# Patient Record
Sex: Female | Born: 2000 | Race: Black or African American | Hispanic: No | Marital: Single | Smoking: Never smoker
Health system: Southern US, Community
[De-identification: ages and names within clinical notes are randomized; demographics above are authoritative.]

## PROBLEM LIST (undated history)

## (undated) DIAGNOSIS — J45909 Unspecified asthma, uncomplicated: Secondary | ICD-10-CM

## (undated) DIAGNOSIS — O139 Gestational [pregnancy-induced] hypertension without significant proteinuria, unspecified trimester: Secondary | ICD-10-CM

## (undated) HISTORY — DX: Gestational (pregnancy-induced) hypertension without significant proteinuria, unspecified trimester: O13.9

## (undated) HISTORY — PX: MOUTH SURGERY: SHX715

---
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
## 2019-01-02 ENCOUNTER — Other Ambulatory Visit: Payer: Self-pay

## 2019-01-02 ENCOUNTER — Ambulatory Visit (INDEPENDENT_AMBULATORY_CARE_PROVIDER_SITE_OTHER): Payer: Medicaid Other | Admitting: Obstetrics and Gynecology

## 2019-01-02 ENCOUNTER — Other Ambulatory Visit (HOSPITAL_COMMUNITY)
Admission: RE | Admit: 2019-01-02 | Discharge: 2019-01-02 | Disposition: A | Discharge status: Home / Self Care | Payer: Medicaid Other | Source: Ambulatory Visit | Attending: Obstetrics and Gynecology | Admitting: Obstetrics and Gynecology

## 2019-01-02 VITALS — BP 130/85 | HR 88 | Wt 144.0 lb

## 2019-01-02 DIAGNOSIS — A749 Chlamydial infection, unspecified: Secondary | ICD-10-CM

## 2019-01-02 DIAGNOSIS — O98811 Other maternal infectious and parasitic diseases complicating pregnancy, first trimester: Secondary | ICD-10-CM | POA: Insufficient documentation

## 2019-01-02 DIAGNOSIS — O98812 Other maternal infectious and parasitic diseases complicating pregnancy, second trimester: Secondary | ICD-10-CM

## 2019-01-02 DIAGNOSIS — Z3480 Encounter for supervision of other normal pregnancy, unspecified trimester: Secondary | ICD-10-CM

## 2019-01-02 DIAGNOSIS — Z3A22 22 weeks gestation of pregnancy: Secondary | ICD-10-CM

## 2019-01-02 NOTE — Progress Notes (Signed)
Prenatal Visit Note Date: 01/02/2019 Clinic: [facility]  Subjective:  Charlissa Petros is a 18 y.o. G1P0 at [redacted]w[redacted]d being seen today for ongoing prenatal care.  She is currently monitored for the following issues for this low-risk pregnancy and has Ludwig's angina; Supervision of other normal pregnancy, antepartum; and Chlamydia infection affecting pregnancy in first trimester on their problem list.  Patient reports no complaints.   Contractions: Not present. Vag. Bleeding: None.  Movement: Present. Denies leaking of fluid.   The following portions of the patient's history were reviewed and updated as appropriate: allergies, current medications, past family history, past medical history, past social history, past surgical history and problem list. Problem list updated.  Objective:   Vitals:   01/02/19 1357  BP: 130/85  Pulse: 88  Weight: 144 lb (65.3 kg)    Fetal Status: Fetal Heart Rate (bpm): 140 Fundal Height: 21 cm Movement: Present     General:  Alert, oriented and cooperative. Patient is in no acute distress.  Skin: Skin is warm and dry. No rash noted.   Cardiovascular: Normal heart rate noted  Respiratory: Normal respiratory effort, no problems with respiration noted  Abdomen: Soft, gravid, appropriate for gestational age. Pain/Pressure: Absent     Pelvic:  Cervical exam deferred        Extremities: Normal range of motion.  Edema: None  Mental Status: Normal mood and affect. Normal behavior. Normal judgment and thought content.   Urinalysis:      Assessment and Plan:  Pregnancy: G1P0 at [redacted]w[redacted]d  1. Supervision of other normal pregnancy, antepartum Routine care. 28wk labs nv - Cervicovaginal ancillary only( [place])  2. Chlamydia infection affecting pregnancy in first trimester toc today - Cervicovaginal ancillary only( [place])  Preterm labor symptoms and general obstetric precautions including but not limited to vaginal bleeding,  contractions, leaking of fluid and fetal movement were reviewed in detail with the patient. Please refer to After Visit Summary for other counseling recommendations.  Return in about 4 weeks (around 01/30/2019) for low risk, in person, 2hr GTT.   Aletha Halim, MD

## 2019-01-02 NOTE — Progress Notes (Signed)
Pt. Declines flu shot at this time - 01/02/2019  Pt. Has no questions or concerns at this time.

## 2019-01-06 ENCOUNTER — Ambulatory Visit (HOSPITAL_COMMUNITY)
Admission: RE | Admit: 2019-01-06 | Discharge: 2019-01-06 | Disposition: A | Discharge status: Home / Self Care | Payer: Medicaid Other | Source: Ambulatory Visit | Attending: Obstetrics and Gynecology | Admitting: Obstetrics and Gynecology

## 2019-01-06 ENCOUNTER — Other Ambulatory Visit: Payer: Self-pay

## 2019-01-06 DIAGNOSIS — Z362 Encounter for other antenatal screening follow-up: Secondary | ICD-10-CM

## 2019-01-06 DIAGNOSIS — Z3A23 23 weeks gestation of pregnancy: Secondary | ICD-10-CM | POA: Diagnosis not present

## 2019-01-07 LAB — CERVICOVAGINAL ANCILLARY ONLY
Chlamydia: POSITIVE — AB
Comment: NEGATIVE
Comment: NEGATIVE
Comment: NORMAL
Neisseria Gonorrhea: NEGATIVE
Trichomonas: NEGATIVE

## 2019-01-08 ENCOUNTER — Other Ambulatory Visit: Payer: Self-pay | Admitting: Obstetrics and Gynecology

## 2019-01-08 DIAGNOSIS — A749 Chlamydial infection, unspecified: Secondary | ICD-10-CM

## 2019-01-08 MED ORDER — AZITHROMYCIN 500 MG PO TABS: 1000.0000 mg | ORAL_TABLET | Freq: Once | ORAL | 1 refills | Status: AC

## 2019-01-28 NOTE — Telephone Encounter (Signed)
Called pt to follow up on her message. Informed pt to increase her fluids and to rest today and to recheck BP later this evening. Also instructed that if chest pain or SOB continues to go to [facility]. To let us know if this happens again or if BP is elevated. Pt states baby is moving well, and denies any vaginal bleeding. Pt verbalizes and understands.

## 2019-01-30 ENCOUNTER — Other Ambulatory Visit: Payer: Self-pay

## 2019-01-30 ENCOUNTER — Ambulatory Visit (INDEPENDENT_AMBULATORY_CARE_PROVIDER_SITE_OTHER): Payer: Medicaid Other | Admitting: Obstetrics and Gynecology

## 2019-01-30 ENCOUNTER — Other Ambulatory Visit: Payer: Medicaid Other

## 2019-01-30 VITALS — BP 128/83 | HR 98 | Wt 148.4 lb

## 2019-01-30 DIAGNOSIS — Z3A26 26 weeks gestation of pregnancy: Secondary | ICD-10-CM

## 2019-01-30 DIAGNOSIS — A749 Chlamydial infection, unspecified: Secondary | ICD-10-CM

## 2019-01-30 DIAGNOSIS — Z3480 Encounter for supervision of other normal pregnancy, unspecified trimester: Secondary | ICD-10-CM

## 2019-01-30 DIAGNOSIS — O98812 Other maternal infectious and parasitic diseases complicating pregnancy, second trimester: Secondary | ICD-10-CM

## 2019-01-30 NOTE — Progress Notes (Signed)
Prenatal Visit Note Date: 01/30/2019 Clinic: [facility]  Subjective:  Julie Spears is a 18 y.o. G1P0 at [redacted]w[redacted]d being seen today for ongoing prenatal care.  She is currently monitored for the following issues for this low-risk pregnancy and has Ludwig's angina; Supervision of other normal pregnancy, antepartum; and Chlamydia infection affecting pregnancy in first trimester on their problem list.  Patient reports felt dizzy and lightheaded at work ([employer]) on Tuesday Contractions: Not present. Vag. Bleeding: None.  Movement: Present. Denies leaking of fluid.   The following portions of the patient's history were reviewed and updated as appropriate: allergies, current medications, past family history, past medical history, past social history, past surgical history and problem list. Problem list updated.  Objective:   Vitals:   01/30/19 0835  BP: 128/83  Pulse: 98  Weight: 148 lb 6.4 oz (67.3 kg)    Fetal Status: Fetal Heart Rate (bpm): 143 Fundal Height: 26 cm Movement: Present     General:  Alert, oriented and cooperative. Patient is in no acute distress.  Skin: Skin is warm and dry. No rash noted.   Cardiovascular: Normal heart rate noted  Respiratory: Normal respiratory effort, no problems with respiration noted  Abdomen: Soft, gravid, appropriate for gestational age. Pain/Pressure: Absent     Pelvic:  Cervical exam deferred        Extremities: Normal range of motion.  Edema: None  Mental Status: Normal mood and affect. Normal behavior. Normal judgment and thought content.   Urinalysis:      Assessment and Plan:  Pregnancy: G1P0 at [redacted]w[redacted]d  1. Supervision of other normal pregnancy, antepartum Watch for s/s but recommend snacking regularly, eating/drinking something if that happens again and compression stockings and belly wrap as event sounds vagal/preload related.  - HIV antibody (with reflex) - RPR - CBC - Glucose Tolerance, 2 Hours w/1  Hour  2. Chlamydia infection affecting pregnancy in first trimester toc nv  Preterm labor symptoms and general obstetric precautions including but not limited to vaginal bleeding, contractions, leaking of fluid and fetal movement were reviewed in detail with the patient. Please refer to After Visit Summary for other counseling recommendations.  Return in about 3 weeks (around 02/20/2019) for low risk, in person.   Aletha Halim, MD

## 2019-01-31 LAB — CBC
Hematocrit: 33 % — ABNORMAL LOW (ref 34.0–46.6)
Hemoglobin: 11.2 g/dL (ref 11.1–15.9)
MCH: 30.2 pg (ref 26.6–33.0)
MCHC: 33.9 g/dL (ref 31.5–35.7)
MCV: 89 fL (ref 79–97)
Platelets: 258 10*3/uL (ref 150–450)
RBC: 3.71 x10E6/uL — ABNORMAL LOW (ref 3.77–5.28)
RDW: 11.9 % (ref 11.7–15.4)
WBC: 12.7 10*3/uL — ABNORMAL HIGH (ref 3.4–10.8)

## 2019-01-31 LAB — GLUCOSE TOLERANCE, 2 HOURS W/ 1HR
Glucose, 1 hour: 103 mg/dL (ref 65–179)
Glucose, 2 hour: 84 mg/dL (ref 65–152)
Glucose, Fasting: 70 mg/dL (ref 65–91)

## 2019-01-31 LAB — HIV ANTIBODY (ROUTINE TESTING W REFLEX): HIV Screen 4th Generation wRfx: NONREACTIVE

## 2019-01-31 LAB — VITAMIN D 25 HYDROXY (VIT D DEFICIENCY, FRACTURES): Vit D, 25-Hydroxy: 10.8 ng/mL — ABNORMAL LOW (ref 30.0–100.0)

## 2019-01-31 LAB — RPR: RPR Ser Ql: NONREACTIVE

## 2019-02-10 ENCOUNTER — Other Ambulatory Visit: Payer: Self-pay | Admitting: Obstetrics and Gynecology

## 2019-02-10 DIAGNOSIS — R7989 Other specified abnormal findings of blood chemistry: Secondary | ICD-10-CM | POA: Insufficient documentation

## 2019-02-10 MED ORDER — CHOLECALCIFEROL 1.25 MG (50000 UT) PO CAPS: 50000.0000 [IU] | ORAL_CAPSULE | ORAL | 0 refills | Status: DC

## 2019-02-20 ENCOUNTER — Other Ambulatory Visit: Payer: Self-pay

## 2019-02-20 ENCOUNTER — Telehealth (INDEPENDENT_AMBULATORY_CARE_PROVIDER_SITE_OTHER): Payer: Medicaid Other | Admitting: Obstetrics and Gynecology

## 2019-02-20 DIAGNOSIS — Z3A29 29 weeks gestation of pregnancy: Secondary | ICD-10-CM

## 2019-02-20 DIAGNOSIS — R7989 Other specified abnormal findings of blood chemistry: Secondary | ICD-10-CM

## 2019-02-20 DIAGNOSIS — A749 Chlamydial infection, unspecified: Secondary | ICD-10-CM

## 2019-02-20 DIAGNOSIS — O98813 Other maternal infectious and parasitic diseases complicating pregnancy, third trimester: Secondary | ICD-10-CM | POA: Diagnosis not present

## 2019-02-20 DIAGNOSIS — Z3480 Encounter for supervision of other normal pregnancy, unspecified trimester: Secondary | ICD-10-CM

## 2019-02-20 NOTE — Progress Notes (Signed)
I connected with  Julie Spears on 02/20/19 at  1:45 PM EST by telephone and verified that I am speaking with the correct person using two identifiers.   I discussed the limitations, risks, security and privacy concerns of performing an evaluation and management service by telephone and the availability of in person appointments. I also discussed with the patient that there may be a patient responsible charge related to this service. The patient expressed understanding and agreed to proceed.  Haydan Mansouri Jeanella Anton, CMA 02/20/2019  1:45 PM

## 2019-02-20 NOTE — Progress Notes (Signed)
   TELEHEALTH VIRTUAL OBSTETRICS VISIT ENCOUNTER NOTE  Clinic: [facility]  I connected with Julie Spears on 02/20/19 at  1:45 PM EST by telephone at home and verified that I am speaking with the correct person using two identifiers.   I discussed the limitations, risks, security and privacy concerns of performing an evaluation and management service by telephone and the availability of in person appointments. I also discussed with the patient that there may be a patient responsible charge related to this service. The patient expressed understanding and agreed to proceed.  Subjective:  Julie Spears is a 18 y.o. G1P0 at [redacted]w[redacted]d being followed for ongoing prenatal care.  She is currently monitored for the following issues for this low-risk pregnancy and has Ludwig's angina; Supervision of other normal pregnancy, antepartum; Chlamydia infection affecting pregnancy in first trimester; and Low serum vitamin D on their problem list.  Patient reports no complaints. Reports fetal movement. Denies any contractions, bleeding or leaking of fluid.   The following portions of the patient's history were reviewed and updated as appropriate: allergies, current medications, past family history, past medical history, past social history, past surgical history and problem list.   Objective:  There were no vitals filed for this visit.  Babyscripts Data Reviewed: not applicable  General:  Alert, oriented and cooperative.   Mental Status: Normal mood and affect perceived. Normal judgment and thought content.  Rest of physical exam deferred due to type of encounter  Assessment and Plan:  Pregnancy: G1P0 at [redacted]w[redacted]d 1. Supervision of other normal pregnancy, antepartum Routine care  2. Low serum vitamin D Needs to pick up  3. Chlamydia infection affecting pregnancy in first trimester Needs toc  Preterm labor symptoms and general obstetric precautions including but not limited to  vaginal bleeding, contractions, leaking of fluid and fetal movement were reviewed in detail with the patient.  I discussed the assessment and treatment plan with the patient. The patient was provided an opportunity to ask questions and all were answered. The patient agreed with the plan and demonstrated an understanding of the instructions. The patient was advised to call back or seek an in-person office evaluation/go to MAU at [facility] for any urgent or concerning symptoms. Please refer to After Visit Summary for other counseling recommendations.   I provided 7 minutes of non-face-to-face time during this encounter. The visit was conducted via MyChart-medicine  No follow-ups on file.  Future Appointments  Date Time Provider [place]  03/13/2019 10:15 AM Donnamae Jude, MD [facility]    Aletha Halim, [place] for [facility], [place]

## 2019-03-13 ENCOUNTER — Other Ambulatory Visit: Payer: Self-pay

## 2019-03-13 ENCOUNTER — Other Ambulatory Visit (HOSPITAL_COMMUNITY)
Admission: RE | Admit: 2019-03-13 | Discharge: 2019-03-13 | Disposition: A | Discharge status: Home / Self Care | Payer: Medicaid Other | Source: Ambulatory Visit | Attending: Family Medicine | Admitting: Family Medicine

## 2019-03-13 ENCOUNTER — Ambulatory Visit (INDEPENDENT_AMBULATORY_CARE_PROVIDER_SITE_OTHER): Payer: Medicaid Other | Admitting: Family Medicine

## 2019-03-13 VITALS — BP 123/80 | HR 94 | Wt 160.0 lb

## 2019-03-13 DIAGNOSIS — Z3A32 32 weeks gestation of pregnancy: Secondary | ICD-10-CM

## 2019-03-13 DIAGNOSIS — Z23 Encounter for immunization: Secondary | ICD-10-CM

## 2019-03-13 DIAGNOSIS — Z3480 Encounter for supervision of other normal pregnancy, unspecified trimester: Secondary | ICD-10-CM | POA: Insufficient documentation

## 2019-03-13 DIAGNOSIS — O98811 Other maternal infectious and parasitic diseases complicating pregnancy, first trimester: Secondary | ICD-10-CM

## 2019-03-13 DIAGNOSIS — A749 Chlamydial infection, unspecified: Secondary | ICD-10-CM

## 2019-03-13 NOTE — Patient Instructions (Signed)

## 2019-03-13 NOTE — Progress Notes (Signed)
   PRENATAL VISIT NOTE  Subjective:  Julie Spears is a 18 y.o. G1P0 at [redacted]w[redacted]d being seen today for ongoing prenatal care.  She is currently monitored for the following issues for this low-risk pregnancy and has Ludwig's angina; Supervision of other normal pregnancy, antepartum; Chlamydia infection affecting pregnancy in first trimester; and Low serum vitamin D on their problem list.  Patient reports no complaints.  Contractions: Not present. Vag. Bleeding: None.  Movement: Present. Denies leaking of fluid.   The following portions of the patient's history were reviewed and updated as appropriate: allergies, current medications, past family history, past medical history, past social history, past surgical history and problem list.   Objective:   Vitals:   03/13/19 1023  BP: 123/80  Pulse: 94  Weight: 160 lb (72.6 kg)    Fetal Status: Fetal Heart Rate (bpm): 135   Movement: Present     General:  Alert, oriented and cooperative. Patient is in no acute distress.  Skin: Skin is warm and dry. No rash noted.   Cardiovascular: Normal heart rate noted  Respiratory: Normal respiratory effort, no problems with respiration noted  Abdomen: Soft, gravid, appropriate for gestational age.  Pain/Pressure: Present     Pelvic: Cervical exam deferred        Extremities: Normal range of motion.  Edema: None  Mental Status: Normal mood and affect. Normal behavior. Normal judgment and thought content.   Assessment and Plan:  Pregnancy: G1P0 at [redacted]w[redacted]d 1. Supervision of other normal pregnancy, antepartum Continue routine prenatal care. To choose pediatrician TDaP given today - Cervicovaginal ancillary only( [place])  2. Chlamydia infection affecting pregnancy in first trimester TOC done  Preterm labor symptoms and general obstetric precautions including but not limited to vaginal bleeding, contractions, leaking of fluid and fetal movement were reviewed in detail with the patient. Please refer to  After Visit Summary for other counseling recommendations.   Return in about 2 weeks (around 03/27/2019) for virtual.  Future Appointments  Date Time Provider [place]  03/27/2019 11:15 AM Anyanwu, Sallyanne Havers, MD [facility]    Donnamae Jude, MD

## 2019-03-14 NOTE — L&D Delivery Note (Signed)
OB/GYN Faculty Practice Delivery Note  Julie Spears is a 19 y.o. G1P0 s/p NSVD at [redacted]w[redacted]d. She was admitted for IOL for PreE without severe features.   ROM: 3h 28m with clear fluid GBS Status:  --Theda Sers (01/28 1424) Maximum Maternal Temperature: 98.3 F    Labor Progress: . Patient arrived at fingertipdilation and was induced with misoprostol x2, then was 3cm and started on pitocin briefly but then had precipitous dilation and delivery.   Delivery Date/Time: 04/14/2019 at 1819 Delivery: Called to room and patient was complete and pushing. Head delivered in ROA position. No nuchal cord present. Shoulder and body delivered in usual fashion. Infant with spontaneous cry, placed on mother's abdomen, dried and stimulated. Cord clamped x 2 after 1-minute delay, and cut by FOB. Cord blood drawn. Placenta delivered spontaneously with gentle cord traction. Fundus firm with massage and Pitocin. Labia, perineum, vagina, and cervix inspected with bilateral labial lacerations, R side was repaired with 4-0 monocryl.   Placenta: 3v intact, to L&D Complications: none Lacerations: bilateral labial lacerations, R side was repaired with 4-0 monocryl EBL: 50 Analgesia: none   Infant: APGAR (1 MIN): 9   APGAR (5 MINS): 9    Weight: 2648 grams  Zack Seal, MD/[facility] OB/GYN Fellow, Faculty Practice

## 2019-03-17 LAB — CERVICOVAGINAL ANCILLARY ONLY
Chlamydia: POSITIVE — AB
Comment: NEGATIVE
Comment: NORMAL
Neisseria Gonorrhea: NEGATIVE

## 2019-03-18 MED ORDER — AZITHROMYCIN 500 MG PO TABS: 1000.0000 mg | ORAL_TABLET | Freq: Once | ORAL | 1 refills | Status: AC

## 2019-03-18 NOTE — Addendum Note (Signed)
Addended by: Reva Bores on: 03/18/2019 07:42 AM   Modules accepted: Orders

## 2019-03-27 ENCOUNTER — Encounter: Payer: Self-pay | Admitting: Obstetrics & Gynecology

## 2019-03-27 ENCOUNTER — Other Ambulatory Visit: Payer: Self-pay

## 2019-03-27 ENCOUNTER — Telehealth (INDEPENDENT_AMBULATORY_CARE_PROVIDER_SITE_OTHER): Payer: Medicaid Other | Admitting: Obstetrics & Gynecology

## 2019-03-27 VITALS — BP 129/77 | HR 94

## 2019-03-27 DIAGNOSIS — O98813 Other maternal infectious and parasitic diseases complicating pregnancy, third trimester: Secondary | ICD-10-CM

## 2019-03-27 DIAGNOSIS — A749 Chlamydial infection, unspecified: Secondary | ICD-10-CM

## 2019-03-27 DIAGNOSIS — Z3A34 34 weeks gestation of pregnancy: Secondary | ICD-10-CM

## 2019-03-27 DIAGNOSIS — Z3480 Encounter for supervision of other normal pregnancy, unspecified trimester: Secondary | ICD-10-CM

## 2019-03-27 MED ORDER — AZITHROMYCIN 500 MG PO TABS: 1000.0000 mg | ORAL_TABLET | Freq: Once | ORAL | 1 refills | Status: AC

## 2019-03-27 NOTE — Patient Instructions (Signed)
Return to office for any scheduled appointments. Call the office or go to the MAU at [facility] if:  You begin to have strong, frequent contractions  Your water breaks.  Sometimes it is a big gush of fluid, sometimes it is just a trickle that keeps getting your panties wet or running down your legs  You have vaginal bleeding.  It is normal to have a small amount of spotting if your cervix was checked.   You do not feel your baby moving like normal.  If you do not, get something to eat and drink and lay down and focus on feeling your baby move.   If your baby is still not moving like normal, you should call the office or go to MAU.  Any other obstetric concerns.   

## 2019-03-27 NOTE — Progress Notes (Signed)
TELEHEALTH OBSTETRICS PRENATAL VIRTUAL VIDEO VISIT ENCOUNTER NOTE  Provider location: Center for [place] at [facility]   I connected with [place] on 03/27/19 at 11:15 AM EST by MyChart Video Encounter at home and verified that I am speaking with the correct person using two identifiers.   I discussed the limitations, risks, security and privacy concerns of performing an evaluation and management service virtually and the availability of in person appointments. I also discussed with the patient that there may be a patient responsible charge related to this service. The patient expressed understanding and agreed to proceed. Subjective:  Julie Spears is a 19 y.o. G1P0 at [redacted]w[redacted]d being seen today for ongoing prenatal care.  She is currently monitored for the following issues for this low-risk pregnancy and has Ludwig's angina; Supervision of other normal pregnancy, antepartum; Chlamydia infection affecting pregnancy; and Low serum vitamin D on their problem list.   Patient reports no complaints.  Contractions: Not present. Vag. Bleeding: None.  Movement: Present. Denies any leaking of fluid.   The following portions of the patient's history were reviewed and updated as appropriate: allergies, current medications, past family history, past medical history, past social history, past surgical history and problem list.   Objective:   Vitals:   03/27/19 1115  BP: 129/77  Pulse: 94    Fetal Status:     Movement: Present     General:  Alert, oriented and cooperative. Patient is in no acute distress.  Respiratory: Normal respiratory effort, no problems with respiration noted  Mental Status: Normal mood and affect. Normal behavior. Normal judgment and thought content.  Rest of physical exam deferred due to type of encounter  Labs: Results for orders placed or performed in visit on 03/13/19 (from the past 504 hour(s))  Cervicovaginal ancillary only( [place])   Collection  Time: 03/13/19 10:40 AM  Result Value Ref Range   Neisseria Gonorrhea Negative    Chlamydia Positive (A)    Comment Normal Reference [place] Chlamydia - Negative    Comment      Normal Reference Range Neisseria Gonorrhea - Negative     Assessment and Plan:  Pregnancy: G1P0 at [redacted]w[redacted]d 1. Chlamydia infection affecting pregnancy in third trimester Recent test of cure is still positive. Discussed implications of chlamydia around the time of birth, possible effects on baby.  Emphasized that she not have any unprotected intercourse until delivery. She has not picked up prescription for Azithromycin, highly encouraged to do so today.  - azithromycin (ZITHROMAX) 500 MG tablet; Take 2 tablets (1,000 mg total) by mouth once for 1 dose.  Dispense: 2 tablet; Refill: 1  2. Supervision of other normal pregnancy, antepartum GBS cultures next visit. Preterm labor symptoms and general obstetric precautions including but not limited to vaginal bleeding, contractions, leaking of fluid and fetal movement were reviewed in detail with the patient. I discussed the assessment and treatment plan with the patient. The patient was provided an opportunity to ask questions and all were answered. The patient agreed with the plan and demonstrated an understanding of the instructions. The patient was advised to call back or seek an in-person office evaluation/go to MAU at [facility] for any urgent or concerning symptoms. Please refer to After Visit Summary for other counseling recommendations.   I provided 10 minutes of face-to-face time during this encounter.  Return in about 2 weeks (around 04/10/2019) for OFFICE OB Visit, Pelvic cultures.  Future Appointments  Date Time Provider [place]  04/10/2019  1:15 PM Reva Bores, MD [facility]    Jaynie Collins, MD Center for [facility], [facility]

## 2019-03-27 NOTE — Progress Notes (Signed)
I connected with  Julie Spears on 03/27/19 at 11:15 AM EST by telephone and verified that I am speaking with the correct person using two identifiers.   I discussed the limitations, risks, security and privacy concerns of performing an evaluation and management service by telephone and the availability of in person appointments. I also discussed with the patient that there may be a patient responsible charge related to this service. The patient expressed understanding and agreed to proceed.  Lerone Onder Emeline Darling, CMA 03/27/2019  11:12 AM

## 2019-04-07 ENCOUNTER — Inpatient Hospital Stay (HOSPITAL_BASED_OUTPATIENT_CLINIC_OR_DEPARTMENT_OTHER): Payer: Medicaid Other

## 2019-04-07 ENCOUNTER — Other Ambulatory Visit: Payer: Self-pay

## 2019-04-07 ENCOUNTER — Inpatient Hospital Stay (HOSPITAL_COMMUNITY)
Admission: AD | Admit: 2019-04-07 | Discharge: 2019-04-07 | Disposition: A | Discharge status: Home / Self Care | Payer: Medicaid Other | Attending: Obstetrics & Gynecology | Admitting: Obstetrics & Gynecology

## 2019-04-07 ENCOUNTER — Encounter (HOSPITAL_COMMUNITY): Payer: Self-pay | Admitting: Obstetrics & Gynecology

## 2019-04-07 DIAGNOSIS — Z3689 Encounter for other specified antenatal screening: Secondary | ICD-10-CM | POA: Diagnosis not present

## 2019-04-07 DIAGNOSIS — O26843 Uterine size-date discrepancy, third trimester: Secondary | ICD-10-CM | POA: Diagnosis not present

## 2019-04-07 DIAGNOSIS — R030 Elevated blood-pressure reading, without diagnosis of hypertension: Secondary | ICD-10-CM | POA: Insufficient documentation

## 2019-04-07 DIAGNOSIS — O133 Gestational [pregnancy-induced] hypertension without significant proteinuria, third trimester: Secondary | ICD-10-CM | POA: Diagnosis not present

## 2019-04-07 DIAGNOSIS — O368190 Decreased fetal movements, unspecified trimester, not applicable or unspecified: Secondary | ICD-10-CM | POA: Diagnosis not present

## 2019-04-07 DIAGNOSIS — Z79899 Other long term (current) drug therapy: Secondary | ICD-10-CM | POA: Diagnosis not present

## 2019-04-07 DIAGNOSIS — O26849 Uterine size-date discrepancy, unspecified trimester: Secondary | ICD-10-CM

## 2019-04-07 DIAGNOSIS — K5900 Constipation, unspecified: Secondary | ICD-10-CM | POA: Diagnosis not present

## 2019-04-07 DIAGNOSIS — O99513 Diseases of the respiratory system complicating pregnancy, third trimester: Secondary | ICD-10-CM | POA: Diagnosis not present

## 2019-04-07 DIAGNOSIS — O26893 Other specified pregnancy related conditions, third trimester: Secondary | ICD-10-CM | POA: Diagnosis not present

## 2019-04-07 DIAGNOSIS — J45909 Unspecified asthma, uncomplicated: Secondary | ICD-10-CM | POA: Insufficient documentation

## 2019-04-07 DIAGNOSIS — O4703 False labor before 37 completed weeks of gestation, third trimester: Secondary | ICD-10-CM | POA: Diagnosis not present

## 2019-04-07 DIAGNOSIS — Z362 Encounter for other antenatal screening follow-up: Secondary | ICD-10-CM | POA: Diagnosis not present

## 2019-04-07 DIAGNOSIS — Z7982 Long term (current) use of aspirin: Secondary | ICD-10-CM | POA: Diagnosis not present

## 2019-04-07 DIAGNOSIS — Z3A36 36 weeks gestation of pregnancy: Secondary | ICD-10-CM | POA: Diagnosis not present

## 2019-04-07 LAB — COMPREHENSIVE METABOLIC PANEL
ALT: 15 U/L (ref 0–44)
AST: 24 U/L (ref 15–41)
Albumin: 3 g/dL — ABNORMAL LOW (ref 3.5–5.0)
Alkaline Phosphatase: 186 U/L — ABNORMAL HIGH (ref 38–126)
Anion gap: 9 (ref 5–15)
BUN: 7 mg/dL (ref 6–20)
CO2: 21 mmol/L — ABNORMAL LOW (ref 22–32)
Calcium: 9.4 mg/dL (ref 8.9–10.3)
Chloride: 108 mmol/L (ref 98–111)
Creatinine, Ser: 0.69 mg/dL (ref 0.44–1.00)
GFR calc Af Amer: >60 mL/min (ref >60)
GFR calc non Af Amer: >60 mL/min (ref >60)
Glucose, Bld: 81 mg/dL (ref 70–99)
Potassium: 4.1 mmol/L (ref 3.5–5.1)
Sodium: 138 mmol/L (ref 135–145)
Total Bilirubin: 0.5 mg/dL (ref 0.3–1.2)
Total Protein: 6.6 g/dL (ref 6.5–8.1)

## 2019-04-07 LAB — CBC
HCT: 31.8 % — ABNORMAL LOW (ref 36.0–46.0)
Hemoglobin: 10.1 g/dL — ABNORMAL LOW (ref 12.0–15.0)
MCH: 27.4 pg (ref 26.0–34.0)
MCHC: 31.8 g/dL (ref 30.0–36.0)
MCV: 86.4 fL (ref 80.0–100.0)
Platelets: 286 10*3/uL (ref 150–400)
RBC: 3.68 MIL/uL — ABNORMAL LOW (ref 3.87–5.11)
RDW: 12.7 % (ref 11.5–15.5)
WBC: 10.4 10*3/uL (ref 4.0–10.5)
nRBC: 0 % (ref 0.0–0.2)

## 2019-04-07 LAB — URINALYSIS, ROUTINE W REFLEX MICROSCOPIC
Bilirubin Urine: NEGATIVE
Glucose, UA: 50 mg/dL — AB
Ketones, ur: NEGATIVE mg/dL
Leukocytes,Ua: NEGATIVE
Nitrite: NEGATIVE
Protein, ur: 100 mg/dL — AB
Specific Gravity, Urine: 1.028 (ref 1.005–1.030)
pH: 6 (ref 5.0–8.0)

## 2019-04-07 LAB — PROTEIN / CREATININE RATIO, URINE
Creatinine, Urine: 305.47 mg/dL
Protein Creatinine Ratio: 0.28 mg/mg{Cre} — ABNORMAL HIGH (ref 0.00–0.15)
Total Protein, Urine: 85 mg/dL

## 2019-04-07 NOTE — Discharge Instructions (Signed)
Keep your appointment in the office on Thursday.  It will need to be an in person appointment. Drink at least 8 8-oz glasses of water every day.

## 2019-04-07 NOTE — MAU Note (Signed)
Past few wks, has been feeling a lot of pressure, it is increasing.  Feeling like she needs to poop, but she can't.   Hasn't pooped in a while, increase in pressure/discomfort when she pushes. When had 4D [place]- was told "it doesn't look like she has a lot of rm".  Fetal movement has slowed down.  Was throwing up last night.

## 2019-04-07 NOTE — MAU Provider Note (Signed)
History     CSN: [id number]  Arrival date and time: 04/07/19 1316   First Provider Initiated Contact with Patient 04/07/19 1449      Chief Complaint  Patient presents with  . pelvic pressure  . Decreased Fetal Movement  . Constipation   HPI Crying on admission, edema of face, decreased fetal movement, family history of preeclampsia, very worried, checking BP at home today was 126/99.  OB History    Gravida  1   Para      Term      Preterm      AB      Living        SAB      TAB      Ectopic      Multiple      Live Births              Past Medical History:  Diagnosis Date  . Asthma     Past Surgical History:  Procedure Laterality Date  . MOUTH SURGERY      Family History  Problem Relation Age of Onset  . Hypertension Mother     Social History   Tobacco Use  . Smoking status: Never Smoker  . Smokeless tobacco: Never Used  Substance Use Topics  . Alcohol use: Never  . Drug use: Never    Allergies: No Known Allergies  Medications Prior to Admission  Medication Sig Dispense Refill Last Dose  . aspirin EC 81 MG tablet Take 1 tablet (81 mg total) by mouth daily. Take after 12 weeks for prevention of preeclampsia later in pregnancy 300 tablet 2 04/07/2019 at Unknown time  . Blood Pressure KIT 1 Device by Does not apply route once a week. To be monitored weekly at home (Patient not taking: Reported on 02/20/2019) 1 kit 0   . Cholecalciferol 1.25 MG (50000 UT) capsule Take 1 capsule (50,000 Units total) by mouth once a week for 12 doses. 12 capsule 0   . cyclobenzaprine (FLEXERIL) 10 MG tablet Take 1 tablet (10 mg total) by mouth 3 (three) times daily as needed for muscle spasms. (Patient not taking: Reported on 12/12/2018) 15 tablet 0   . famotidine (PEPCID) 20 MG tablet Take 1 tablet (20 mg total) by mouth 2 (two) times daily. (Patient not taking: Reported on 01/02/2019) 60 tablet 1   . Prenatal Vit-Fe Fumarate-FA (MULTIVITAMIN-PRENATAL) 27-0.8  MG TABS tablet Take 1 tablet by mouth daily at 12 noon.     . promethazine (PHENERGAN) 12.5 MG tablet Take 1 tablet (12.5 mg total) by mouth every 6 (six) hours as needed for nausea or vomiting. (Patient not taking: Reported on 12/12/2018) 30 tablet 0     Review of Systems  Constitutional: Negative for fever.  Respiratory: Negative for cough and shortness of breath.   Gastrointestinal: Positive for abdominal pain. Negative for diarrhea, nausea and vomiting.       Decreased fetal movement  Genitourinary: Negative for dysuria, vaginal bleeding and vaginal discharge.   Physical Exam   Blood pressure 131/87, pulse 88, temperature 98.5 F (36.9 C), temperature source Oral, resp. rate 16, height '5\' 2"'  (1.575 m), weight 75.7 kg, last menstrual period 07/27/2018, SpO2 98 %.  Physical Exam  Nursing note and vitals reviewed. Constitutional: She is oriented to person, place, and time. She appears well-developed and well-nourished.  HENT:  Head: Normocephalic.  Eyes: EOM are normal.  GI: Soft. There is no abdominal tenderness. There is no rebound and no guarding.  FHT baseline 125 with moderate variability and 15x15 accels noted. Occasional contractions No decelerations Reactive NST   Musculoskeletal:        General: Normal range of motion.     Cervical back: Neck supple.  Neurological: She is alert and oriented to person, place, and time.  Skin: Skin is warm and dry.  Psychiatric: She has a normal mood and affect.   Serial BPs done on arrival 140/88 and 147/94.  Serials then ended with 134/86, 124/81, 131/87 MAU Course  Procedures LABS Results for orders placed or performed during the hospital encounter of 04/07/19 (from the past 24 hour(s))  Urinalysis, Routine w reflex microscopic     Status: Abnormal   Collection Time: 04/07/19  1:52 PM  Result Value Ref Range   Color, Urine YELLOW YELLOW   APPearance CLOUDY (A) CLEAR   Specific Gravity, Urine 1.028 1.005 - 1.030   pH 6.0 5.0 -  8.0   Glucose, UA 50 (A) NEGATIVE mg/dL   Hgb urine dipstick LARGE (A) NEGATIVE   Bilirubin Urine NEGATIVE NEGATIVE   Ketones, ur NEGATIVE NEGATIVE mg/dL   Protein, ur 100 (A) NEGATIVE mg/dL   Nitrite NEGATIVE NEGATIVE   Leukocytes,Ua NEGATIVE NEGATIVE   RBC / HPF 21-50 0 - 5 RBC/hpf   WBC, UA 6-10 0 - 5 WBC/hpf   Bacteria, UA FEW (A) NONE SEEN   Squamous Epithelial / LPF 11-20 0 - 5   Mucus PRESENT   Protein / creatinine ratio, urine     Status: Abnormal   Collection Time: 04/07/19  1:52 PM  Result Value Ref Range   Creatinine, Urine 305.47 mg/dL   Total Protein, Urine 85 mg/dL   Protein Creatinine Ratio 0.28 (H) 0.00 - 0.15 mg/mg[Cre]  CBC     Status: Abnormal   Collection Time: 04/07/19  2:52 PM  Result Value Ref Range   WBC 10.4 4.0 - 10.5 K/uL   RBC 3.68 (L) 3.87 - 5.11 MIL/uL   Hemoglobin 10.1 (L) 12.0 - 15.0 g/dL   HCT 31.8 (L) 36.0 - 46.0 %   MCV 86.4 80.0 - 100.0 fL   MCH 27.4 26.0 - 34.0 pg   MCHC 31.8 30.0 - 36.0 g/dL   RDW 12.7 11.5 - 15.5 %   Platelets 286 150 - 400 K/uL   nRBC 0.0 0.0 - 0.2 %  Comprehensive metabolic panel     Status: Abnormal   Collection Time: 04/07/19  2:52 PM  Result Value Ref Range   Sodium 138 135 - 145 mmol/L   Potassium 4.1 3.5 - 5.1 mmol/L   Chloride 108 98 - 111 mmol/L   CO2 21 (L) 22 - 32 mmol/L   Glucose, Bld 81 70 - 99 mg/dL   BUN 7 6 - 20 mg/dL   Creatinine, Ser 0.69 0.44 - 1.00 mg/dL   Calcium 9.4 8.9 - 10.3 mg/dL   Total Protein 6.6 6.5 - 8.1 g/dL   Albumin 3.0 (L) 3.5 - 5.0 g/dL   AST 24 15 - 41 U/L   ALT 15 0 - 44 U/L   Alkaline Phosphatase 186 (H) 38 - 126 U/L   Total Bilirubin 0.5 0.3 - 1.2 mg/dL   GFR calc non Af Amer >60 >60 mL/min   GFR calc Af Amer >60 >60 mL/min   Anion gap 9 5 - 15     MDM Talked with client - will watch BP - some were elevated today but labs are in normal range so may have gestational  hypertension if BP is elevated on Thursday.  Or may develop preeclampsia if symptoms are  worsening.  Assessment and Plan  Reactive NST and client is now noticing that baby is now moving well  Elevated BP without diagnosis of hypertension Threatened preterm labor - some contractions but cervix is closed and posterior  Plan Keep your appointment in the office on Thursday.  It will need to be an in person appointment. Drink at least 8 8-oz glasses of water every day. Return to MAU if your symptoms are worsening. See AVS for additional information given to client.  Virginia Rochester 04/07/2019, 4:34 PM

## 2019-04-08 LAB — CULTURE, OB URINE: Culture: NO GROWTH

## 2019-04-10 ENCOUNTER — Ambulatory Visit (INDEPENDENT_AMBULATORY_CARE_PROVIDER_SITE_OTHER): Payer: Medicaid Other | Admitting: Family Medicine

## 2019-04-10 ENCOUNTER — Other Ambulatory Visit: Payer: Self-pay | Admitting: Student

## 2019-04-10 ENCOUNTER — Other Ambulatory Visit: Payer: Self-pay

## 2019-04-10 ENCOUNTER — Other Ambulatory Visit (HOSPITAL_COMMUNITY)
Admission: RE | Admit: 2019-04-10 | Discharge: 2019-04-10 | Disposition: A | Discharge status: Home / Self Care | Payer: Medicaid Other | Source: Ambulatory Visit | Attending: Family Medicine | Admitting: Family Medicine

## 2019-04-10 VITALS — BP 142/91 | HR 87 | Wt 167.0 lb

## 2019-04-10 DIAGNOSIS — Z3480 Encounter for supervision of other normal pregnancy, unspecified trimester: Secondary | ICD-10-CM

## 2019-04-10 DIAGNOSIS — O1493 Unspecified pre-eclampsia, third trimester: Secondary | ICD-10-CM | POA: Insufficient documentation

## 2019-04-10 DIAGNOSIS — O133 Gestational [pregnancy-induced] hypertension without significant proteinuria, third trimester: Secondary | ICD-10-CM

## 2019-04-10 DIAGNOSIS — Z3A36 36 weeks gestation of pregnancy: Secondary | ICD-10-CM

## 2019-04-10 NOTE — Patient Instructions (Signed)

## 2019-04-10 NOTE — Progress Notes (Signed)
   PRENATAL VISIT NOTE  Subjective:  Julie Spears is a 19 y.o. G1P0 at [redacted]w[redacted]d being seen today for ongoing prenatal care.  She is currently monitored for the following issues for this low-risk pregnancy and has Ludwig's angina; Supervision of other normal pregnancy, antepartum; Chlamydia infection affecting pregnancy; Low serum vitamin D; and Gestational hypertension on their problem list.  Patient reports no complaints.  Contractions: Irritability.  .  Movement: Present. Denies leaking of fluid.   The following portions of the patient's history were reviewed and updated as appropriate: allergies, current medications, past family history, past medical history, past social history, past surgical history and problem list.   Objective:   Vitals:   04/10/19 1322  BP: (!) 142/91  Pulse: 87  Weight: 167 lb (75.8 kg)    Fetal Status: Fetal Heart Rate (bpm): 132 Fundal Height: 32 cm Movement: Present  Presentation: Vertex  General:  Alert, oriented and cooperative. Patient is in no acute distress.  Skin: Skin is warm and dry. No rash noted.   Cardiovascular: Normal heart rate noted  Respiratory: Normal respiratory effort, no problems with respiration noted  Abdomen: Soft, gravid, appropriate for gestational age.  Pain/Pressure: Present     Pelvic: Cervical exam performed Dilation: Fingertip Effacement (%): Thick Station: -3  Extremities: Normal range of motion.  Edema: Trace  Mental Status: Normal mood and affect. Normal behavior. Normal judgment and thought content.   Assessment and Plan:  Pregnancy: G1P0 at [redacted]w[redacted]d 1. Supervision of other normal pregnancy, antepartum Cultures today - Strep Gp B NAA - GC/Chlamydia probe amp ([place])not at [facility]  2. Gestational hypertension, third trimester Elevated BP on multiple occasions at home, no severe range. No headaches. IOL scheduled for 37 weeks. Orders placed.  Preterm labor symptoms and general obstetric precautions including but not  limited to vaginal bleeding, contractions, leaking of fluid and fetal movement were reviewed in detail with the patient. Please refer to After Visit Summary for other counseling recommendations.   Return in 1 week (on 04/17/2019).  Future Appointments  Date Time Provider Department Center  04/14/2019  7:00 AM [facility] SCHED ROOM [facility] None  05/21/2019  1:15 PM Federico Flake, MD [facility]    Reva Bores, MD

## 2019-04-11 ENCOUNTER — Encounter (HOSPITAL_COMMUNITY): Payer: Self-pay | Admitting: *Deleted

## 2019-04-11 ENCOUNTER — Telehealth (HOSPITAL_COMMUNITY): Payer: Self-pay | Admitting: *Deleted

## 2019-04-11 NOTE — Telephone Encounter (Signed)
Preadmission screen  

## 2019-04-12 ENCOUNTER — Other Ambulatory Visit (HOSPITAL_COMMUNITY)
Admission: RE | Admit: 2019-04-12 | Discharge: 2019-04-12 | Disposition: A | Discharge status: Home / Self Care | Payer: Medicaid Other | Source: Ambulatory Visit | Attending: Family Medicine | Admitting: Family Medicine

## 2019-04-12 DIAGNOSIS — Z20822 Contact with and (suspected) exposure to covid-19: Secondary | ICD-10-CM | POA: Diagnosis not present

## 2019-04-12 DIAGNOSIS — Z01812 Encounter for preprocedural laboratory examination: Secondary | ICD-10-CM | POA: Insufficient documentation

## 2019-04-12 LAB — STREP GP B NAA: Strep Gp B NAA: NEGATIVE

## 2019-04-12 LAB — SARS CORONAVIRUS 2 (TAT 6-24 HRS): SARS Coronavirus 2: NEGATIVE

## 2019-04-14 ENCOUNTER — Inpatient Hospital Stay (HOSPITAL_COMMUNITY): Admit: 2019-04-14 | Payer: Medicaid Other | Admitting: Family Medicine

## 2019-04-14 ENCOUNTER — Inpatient Hospital Stay (HOSPITAL_COMMUNITY)
Admission: AD | Admit: 2019-04-14 | Discharge: 2019-04-16 | DRG: 807 | Disposition: A | Discharge status: Home / Self Care | Payer: Medicaid Other | Attending: Obstetrics & Gynecology | Admitting: Obstetrics & Gynecology

## 2019-04-14 ENCOUNTER — Encounter (HOSPITAL_COMMUNITY): Payer: Self-pay | Admitting: Obstetrics & Gynecology

## 2019-04-14 ENCOUNTER — Inpatient Hospital Stay (HOSPITAL_COMMUNITY): Payer: Medicaid Other

## 2019-04-14 ENCOUNTER — Other Ambulatory Visit: Payer: Self-pay

## 2019-04-14 DIAGNOSIS — Z3A37 37 weeks gestation of pregnancy: Secondary | ICD-10-CM

## 2019-04-14 DIAGNOSIS — J45909 Unspecified asthma, uncomplicated: Secondary | ICD-10-CM | POA: Diagnosis present

## 2019-04-14 DIAGNOSIS — Z3490 Encounter for supervision of normal pregnancy, unspecified, unspecified trimester: Secondary | ICD-10-CM

## 2019-04-14 DIAGNOSIS — O151 Eclampsia in labor: Secondary | ICD-10-CM

## 2019-04-14 DIAGNOSIS — O1493 Unspecified pre-eclampsia, third trimester: Secondary | ICD-10-CM | POA: Diagnosis present

## 2019-04-14 DIAGNOSIS — O1404 Mild to moderate pre-eclampsia, complicating childbirth: Principal | ICD-10-CM | POA: Diagnosis present

## 2019-04-14 DIAGNOSIS — O134 Gestational [pregnancy-induced] hypertension without significant proteinuria, complicating childbirth: Secondary | ICD-10-CM | POA: Diagnosis present

## 2019-04-14 DIAGNOSIS — A749 Chlamydial infection, unspecified: Secondary | ICD-10-CM | POA: Diagnosis present

## 2019-04-14 DIAGNOSIS — O9952 Diseases of the respiratory system complicating childbirth: Secondary | ICD-10-CM | POA: Diagnosis present

## 2019-04-14 LAB — COMPREHENSIVE METABOLIC PANEL
ALT: 15 U/L (ref 0–44)
AST: 31 U/L (ref 15–41)
Albumin: 3 g/dL — ABNORMAL LOW (ref 3.5–5.0)
Alkaline Phosphatase: 213 U/L — ABNORMAL HIGH (ref 38–126)
Anion gap: 9 (ref 5–15)
BUN: 7 mg/dL (ref 6–20)
CO2: 19 mmol/L — ABNORMAL LOW (ref 22–32)
Calcium: 9.4 mg/dL (ref 8.9–10.3)
Chloride: 109 mmol/L (ref 98–111)
Creatinine, Ser: 0.71 mg/dL (ref 0.44–1.00)
GFR calc Af Amer: >60 mL/min (ref >60)
GFR calc non Af Amer: >60 mL/min (ref >60)
Glucose, Bld: 84 mg/dL (ref 70–99)
Potassium: 4.1 mmol/L (ref 3.5–5.1)
Sodium: 137 mmol/L (ref 135–145)
Total Bilirubin: 0.3 mg/dL (ref 0.3–1.2)
Total Protein: 6.7 g/dL (ref 6.5–8.1)

## 2019-04-14 LAB — CBC
HCT: 34.7 % — ABNORMAL LOW (ref 36.0–46.0)
Hemoglobin: 10.8 g/dL — ABNORMAL LOW (ref 12.0–15.0)
MCH: 26.6 pg (ref 26.0–34.0)
MCHC: 31.1 g/dL (ref 30.0–36.0)
MCV: 85.5 fL (ref 80.0–100.0)
Platelets: 297 10*3/uL (ref 150–400)
RBC: 4.06 MIL/uL (ref 3.87–5.11)
RDW: 12.9 % (ref 11.5–15.5)
WBC: 11.3 10*3/uL — ABNORMAL HIGH (ref 4.0–10.5)
nRBC: 0 % (ref 0.0–0.2)

## 2019-04-14 LAB — TYPE AND SCREEN
ABO/RH(D): B POS
Antibody Screen: NEGATIVE

## 2019-04-14 LAB — GC/CHLAMYDIA PROBE AMP (~~LOC~~) NOT AT ARMC
Chlamydia: NEGATIVE
Comment: NEGATIVE
Comment: NORMAL
Neisseria Gonorrhea: NEGATIVE

## 2019-04-14 LAB — RPR: RPR Ser Ql: NONREACTIVE

## 2019-04-14 LAB — PROTEIN / CREATININE RATIO, URINE
Creatinine, Urine: 181.6 mg/dL
Protein Creatinine Ratio: 0.57 mg/mg{Cre} — ABNORMAL HIGH (ref 0.00–0.15)
Total Protein, Urine: 103 mg/dL

## 2019-04-14 MED ORDER — OXYTOCIN BOLUS FROM INFUSION
500.0000 mL | Freq: Once | INTRAVENOUS | Status: AC
  Administered 2019-04-14: 500 mL via INTRAVENOUS

## 2019-04-14 MED ORDER — SENNOSIDES-DOCUSATE SODIUM 8.6-50 MG PO TABS
2.0000 | ORAL_TABLET | ORAL | Status: DC
  Administered 2019-04-15 – 2019-04-16 (×2): 2 via ORAL
  Filled 2019-04-14 (×2): qty 2

## 2019-04-14 MED ORDER — OXYCODONE HCL 5 MG PO TABS: 10.0000 mg | ORAL_TABLET | ORAL | Status: DC | PRN

## 2019-04-14 MED ORDER — OXYTOCIN 40 UNITS IN NORMAL SALINE INFUSION - SIMPLE MED
2.5000 [IU]/h | INTRAVENOUS | Status: DC
  Filled 2019-04-14: qty 1000

## 2019-04-14 MED ORDER — ACETAMINOPHEN 325 MG PO TABS: 650.0000 mg | ORAL_TABLET | ORAL | Status: DC | PRN

## 2019-04-14 MED ORDER — DIPHENHYDRAMINE HCL 25 MG PO CAPS: 25.0000 mg | ORAL_CAPSULE | Freq: Four times a day (QID) | ORAL | Status: DC | PRN

## 2019-04-14 MED ORDER — COCONUT OIL OIL: 1.0000 "application " | TOPICAL_OIL | Status: DC | PRN

## 2019-04-14 MED ORDER — DIBUCAINE (PERIANAL) 1 % EX OINT: 1.0000 "application " | TOPICAL_OINTMENT | CUTANEOUS | Status: DC | PRN

## 2019-04-14 MED ORDER — FENTANYL CITRATE (PF) 100 MCG/2ML IJ SOLN
50.0000 ug | INTRAMUSCULAR | Status: DC | PRN
  Administered 2019-04-14 (×2): 50 ug via INTRAVENOUS
  Filled 2019-04-14 (×2): qty 2

## 2019-04-14 MED ORDER — OXYCODONE-ACETAMINOPHEN 5-325 MG PO TABS: 1.0000 | ORAL_TABLET | ORAL | Status: DC | PRN

## 2019-04-14 MED ORDER — WITCH HAZEL-GLYCERIN EX PADS: 1.0000 "application " | MEDICATED_PAD | CUTANEOUS | Status: DC | PRN

## 2019-04-14 MED ORDER — OXYCODONE HCL 5 MG PO TABS: 5.0000 mg | ORAL_TABLET | ORAL | Status: DC | PRN

## 2019-04-14 MED ORDER — OXYTOCIN 40 UNITS IN NORMAL SALINE INFUSION - SIMPLE MED
1.0000 m[IU]/min | INTRAVENOUS | Status: DC
  Administered 2019-04-14: 2 m[IU]/min via INTRAVENOUS

## 2019-04-14 MED ORDER — ONDANSETRON HCL 4 MG PO TABS: 4.0000 mg | ORAL_TABLET | ORAL | Status: DC | PRN

## 2019-04-14 MED ORDER — LACTATED RINGERS IV SOLN: INTRAVENOUS | Status: DC

## 2019-04-14 MED ORDER — TERBUTALINE SULFATE 1 MG/ML IJ SOLN: 0.2500 mg | Freq: Once | INTRAMUSCULAR | Status: DC | PRN

## 2019-04-14 MED ORDER — IBUPROFEN 600 MG PO TABS
600.0000 mg | ORAL_TABLET | Freq: Four times a day (QID) | ORAL | Status: DC
  Administered 2019-04-15 – 2019-04-16 (×6): 600 mg via ORAL
  Filled 2019-04-14 (×6): qty 1

## 2019-04-14 MED ORDER — MISOPROSTOL 25 MCG QUARTER TABLET
25.0000 ug | ORAL_TABLET | ORAL | Status: DC | PRN
  Administered 2019-04-14 (×2): 25 ug via VAGINAL
  Filled 2019-04-14 (×2): qty 1

## 2019-04-14 MED ORDER — LACTATED RINGERS IV SOLN: 500.0000 mL | INTRAVENOUS | Status: DC | PRN

## 2019-04-14 MED ORDER — MAGNESIUM HYDROXIDE 400 MG/5ML PO SUSP: 30.0000 mL | ORAL | Status: DC | PRN

## 2019-04-14 MED ORDER — ONDANSETRON HCL 4 MG/2ML IJ SOLN: 4.0000 mg | Freq: Four times a day (QID) | INTRAMUSCULAR | Status: DC | PRN

## 2019-04-14 MED ORDER — SIMETHICONE 80 MG PO CHEW: 80.0000 mg | CHEWABLE_TABLET | ORAL | Status: DC | PRN

## 2019-04-14 MED ORDER — ONDANSETRON HCL 4 MG/2ML IJ SOLN: 4.0000 mg | INTRAMUSCULAR | Status: DC | PRN

## 2019-04-14 MED ORDER — SOD CITRATE-CITRIC ACID 500-334 MG/5ML PO SOLN: 30.0000 mL | ORAL | Status: DC | PRN

## 2019-04-14 MED ORDER — OXYCODONE-ACETAMINOPHEN 5-325 MG PO TABS: 2.0000 | ORAL_TABLET | ORAL | Status: DC | PRN

## 2019-04-14 MED ORDER — BENZOCAINE-MENTHOL 20-0.5 % EX AERO
1.0000 "application " | INHALATION_SPRAY | CUTANEOUS | Status: DC | PRN
  Administered 2019-04-15: 1 via TOPICAL
  Filled 2019-04-14: qty 56

## 2019-04-14 MED ORDER — LIDOCAINE HCL (PF) 1 % IJ SOLN
30.0000 mL | INTRAMUSCULAR | Status: AC | PRN
  Administered 2019-04-14: 30 mL via SUBCUTANEOUS
  Filled 2019-04-14: qty 30

## 2019-04-14 MED ORDER — PRENATAL MULTIVITAMIN CH
1.0000 | ORAL_TABLET | Freq: Every day | ORAL | Status: DC
  Administered 2019-04-15 – 2019-04-16 (×2): 1 via ORAL
  Filled 2019-04-14 (×2): qty 1

## 2019-04-14 NOTE — Progress Notes (Signed)
Julie Spears MRN: [id number]  Subjective: -Patient resting in bed.  Reports perception of contractions, but continues to deny HA, visual disturbances, or RUQ pain. FOB, Josh, at bedside supportive.   Objective: BP (!) 130/91   Pulse 77   Temp 98 F (36.7 C) (Oral)   Resp 15   Ht 5\' 2"  (1.575 m)   Wt 75.8 kg   LMP 07/27/2018   BMI 30.56 kg/m  No intake/output data recorded. No intake/output data recorded.  Fetal Monitoring: FHT: 120 bpm, Mod Var, -Decels, +10x10 Accels UC: Q2-57min    Vaginal Exam: SVE:   Dilation: Fingertip Effacement (%): 50 Station: -1 Exam by:: E.Rothermel RN/J. Torres RN Membranes:Intact Internal Monitors: None  Augmentation/Induction: Pitocin:None Cytotec: S/P 2nd Dose  Assessment:  IUP at 37.1 weeks Cat I FT  IOL GHTN  Plan: -Due for next check at 430pm, will consider foley if appropriate. -Continue other mgmt as ordered.   [phone], CNM Advanced Practice Provider, Center for [facility] 04/14/2019, 2:37 PM

## 2019-04-14 NOTE — H&P (Addendum)
OBSTETRIC ADMISSION HISTORY AND PHYSICAL  Julie Spears is a 19 y.o. female G1P0 with IUP at 69w1dby 6wk [place] for IOL d/t gHTN. She reports +FMs, No LOF, no VB, no blurry vision, headaches or peripheral edema, and RUQ pain.  She plans on breast feeding. She is considering PP IUD for birth control. [facility] Dating: By 6wk [place]--->  Estimated Date of Delivery: 05/04/19  Sono:    '@[redacted]w[redacted]d' , CWD, normal anatomy, breech presentation, fundal placental lie, 274g, 43% EFW '@[redacted]w[redacted]d' , CWD, normal anatomy, cephalic presentation, anterior placental lie, 2739g, 39%  Prenatal History/Complications: Gestational HTN Chlamydia, no TOC yet, positive 03/13/2019  Teenage pregnancy Ludwig's Angina Asthma Low Vit D  Past Medical History: Past Medical History:  Diagnosis Date  . Asthma   . Pregnancy induced hypertension     Past Surgical History: Past Surgical History:  Procedure Laterality Date  . MOUTH SURGERY      Obstetrical History: OB History    Gravida  1   Para      Term      Preterm      AB      Living        SAB      TAB      Ectopic      Multiple      Live Births              Social History: Social History   Socioeconomic History  . Marital status: Single    Spouse name: Not on file  . Number of children: Not on file  . Years of education: Not on file  . Highest education level: Not on file  Occupational History  . Not on file  Tobacco Use  . Smoking status: Never Smoker  . Smokeless tobacco: Never Used  Substance and Sexual Activity  . Alcohol use: Never  . Drug use: Never  . Sexual activity: Not on file  Other Topics Concern  . Not on file  Social History Narrative   Lives at home with mother, 148yosister, 154yosister, 10yo brother, and 163morother. No smokers in the home.   Social Determinants of Health   Financial Resource Strain:   . Difficulty of Paying Living Expenses: Not on file  Food  Insecurity:   . Worried About RuCharity fundraisern the Last Year: Not on file  . Ran Out of Food in the Last Year: Not on file  Transportation Needs:   . Lack of Transportation (Medical): Not on file  . Lack of Transportation (Non-Medical): Not on file  Physical Activity:   . Days of Exercise per Week: Not on file  . Minutes of Exercise per Session: Not on file  Stress:   . Feeling of Stress : Not on file  Social Connections:   . Frequency of Communication with Friends and Family: Not on file  . Frequency of Social Gatherings with Friends and Family: Not on file  . Attends Religious Services: Not on file  . Active Member of Clubs or Organizations: Not on file  . Attends ClArchivisteetings: Not on file  . Marital Status: Not on file    Family History: Family History  Problem Relation Age of Onset  . Hypertension Mother     Allergies: No Known Allergies  Medications Prior to Admission  Medication Sig Dispense Refill Last Dose  . aspirin EC 81 MG tablet Take 1 tablet (  81 mg total) by mouth daily. Take after 12 weeks for prevention of preeclampsia later in pregnancy 300 tablet 2 04/13/2019 at Unknown time  . Prenatal Vit-Fe Fumarate-FA (MULTIVITAMIN-PRENATAL) 27-0.8 MG TABS tablet Take 1 tablet by mouth daily at 12 noon.   Past Week at Unknown time  . Blood Pressure KIT 1 Device by Does not apply route once a week. To be monitored weekly at home 1 kit 0   . Cholecalciferol 1.25 MG (50000 UT) capsule Take 1 capsule (50,000 Units total) by mouth once a week for 12 doses. 12 capsule 0   . cyclobenzaprine (FLEXERIL) 10 MG tablet Take 1 tablet (10 mg total) by mouth 3 (three) times daily as needed for muscle spasms. 15 tablet 0   . famotidine (PEPCID) 20 MG tablet Take 1 tablet (20 mg total) by mouth 2 (two) times daily. 60 tablet 1   . promethazine (PHENERGAN) 12.5 MG tablet Take 1 tablet (12.5 mg total) by mouth every 6 (six) hours as needed for nausea or vomiting. 30 tablet  0      Review of Systems   All systems reviewed and negative except as stated in HPI  Blood pressure (!) 143/96, pulse 94, temperature 98.3 F (36.8 C), temperature source Oral, resp. rate 16, weight 75.8 kg, last menstrual period 07/27/2018. General appearance: alert, cooperative, appears stated age and no distress Lungs: normal effort Heart: regular rate  Abdomen: soft, non-tender; bowel sounds normal Pelvic: gravid uterus GU: No vaginal lesions  Extremities: Homans sign is negative, no sign of DVT DTR's intact Presentation: cephalic Fetal monitoringBaseline: 120 bpm, Variability: Good {> 6 bpm), Accelerations: Reactive and Decelerations: Absent Uterine activity: None     Prenatal labs: ABO, Rh: B/Positive/-- (08/05 1630) Antibody: Negative (08/05 1630) Rubella: 1.74 (08/05 1630) RPR: Non Reactive (11/19 0834)  HBsAg: Negative (08/05 1630)  HIV: Non Reactive (11/19 0834)  GBS: --Henderson Cloud (01/28 1424)  2 hr Glucola 84 Genetic screening  Declined  Anatomy US WNL  Prenatal Transfer Tool  Maternal Diabetes: No Genetic Screening: Declined Maternal Ultrasounds/Referrals: Normal Fetal Ultrasounds or other Referrals:  None Maternal Substance Abuse:  No Significant Maternal Medications:  None Significant Maternal Lab Results: Group B Strep negative and Other: chlamydia positive  No results found for this or any previous visit (from the past 24 hour(s)).  Patient Active Problem List   Diagnosis Date Noted  . [redacted] weeks gestation of pregnancy 04/14/2019  . Gestational hypertension 04/10/2019  . Low serum vitamin D 02/10/2019  . Supervision of other normal pregnancy, antepartum 10/16/2018  . Chlamydia infection affecting pregnancy 10/16/2018  . 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 G1P0 at 74w1dhere for IOL d/t gHTN diagnosed at 36w.  #Labor: Risks and benefits of induction were reviewed, including failure of method, prolonged  labor, need for further intervention, risk of cesarean.  Patient and family seem to understand these risks and wish to proceed. Options of cytotec, foley bulb, AROM, and pitocin reviewed, with use of each discussed. Plan to start induction with cytotec x1 and FB when able. OB admission labs pending. #gHTN: mild range pressures at home. Asymptomatic. Labs pending. Continue to monitor. #Chlamydia: TOC pending. Discussed importance of eyrthromycin eye ointment for infant to prevent infection. #Pain: Per patient request. #FWB: Cat I; EFW: 3000g #ID:  GBS neg #MOF: breast #MOC: PP IUD #Circ:  N/A  CGladys Damme MD [facility], PGY-1 04/14/2019, 7:21 AM  I saw and  evaluated the patient. I agree with the findings and the plan of care as documented in the resident's note. Vertex by BSUS. IOL for gHTN vs Pre-E. Labs pending.  Barrington Ellison, MD [facility] Family Medicine Fellow, [facility] for [employer], [place]

## 2019-04-14 NOTE — Discharge Summary (Addendum)
Postpartum Discharge Summary  Date of Service updated 04/16/2019     Patient Name: Julie Spears DOB: Jan 21, 2001 MRN: [id number]  Date of admission: 04/14/2019 Delivering Provider: Clarnce Flock   Date of discharge: 04/16/2019  Admitting diagnosis: Indication for care in labor and delivery, antepartum [O75.9] Intrauterine pregnancy: [redacted]w[redacted]d    Secondary diagnosis:  Active Problems:   Chlamydia infection affecting pregnancy   Hypertension in pregnancy, preeclampsia, third trimester   [redacted] weeks gestation of pregnancy  Additional problems: None     Discharge diagnosis: Term Pregnancy Delivered and Preeclampsia (mild)                                                                                                Post partum procedures: None  Augmentation: Pitocin and Cytotec  Complications: None  Hospital course:  Induction of Labor With Vaginal Delivery   19y.o. yo G1P0 at 35w1das admitted to the hospital 04/14/2019 for induction of labor.  Indication for induction: Preeclampsia.  Patient had an uncomplicated labor course as follows: arrived fingertip, miso x2 then pit, precipitous dilation/delivery.  Membrane Rupture Time/Date: 3:15 PM ,04/14/2019   Intrapartum Procedures: Episiotomy: None [1]                                         Lacerations:  Labial [10]  Patient had delivery of a Viable infant.  Information for the patient's newborn:  HuAriyah, Sedlack0[[id number]]Delivery Method: Vaginal, Spontaneous(Filed from Delivery Summary)   04/14/2019  Details of delivery can be found in separate delivery note.  Patient had a routine postpartum course. Patient is discharged home 04/16/19. Delivery time: 6:19 PM    Magnesium Sulfate received: No BMZ received: No Rhophylac:N/A MMR:N/A Transfusion:No  Physical exam  Vitals:   04/15/19 0942 04/15/19 1421 04/15/19 2226 04/16/19 0550  BP: (!) 141/94 (!) 143/90 118/77 129/85  Pulse: 80 90 99 74  Resp: '18 16 18 19  ' Temp:  98.6 F (37 C) 98.2 F (36.8 C) 99.2 F (37.3 C) 98.1 F (36.7 C)  TempSrc: Oral Oral Oral Oral  SpO2:  100%  99%  Weight:      Height:       General: alert Lochia: appropriate Uterine Fundus: firm Incision: N/A DVT Evaluation: No evidence of DVT seen on physical exam.  Labs: Lab Results  Component Value Date   WBC 11.3 (H) 04/14/2019   HGB 10.8 (L) 04/14/2019   HCT 34.7 (L) 04/14/2019   MCV 85.5 04/14/2019   PLT 297 04/14/2019   CMP Latest Ref Rng & Units 04/14/2019  Glucose 70 - 99 mg/dL 84  BUN 6 - 20 mg/dL 7  Creatinine 0.44 - 1.00 mg/dL 0.71  Sodium 135 - 145 mmol/L 137  Potassium 3.5 - 5.1 mmol/L 4.1  Chloride 98 - 111 mmol/L 109  CO2 22 - 32 mmol/L 19(L)  Calcium 8.9 - 10.3 mg/dL 9.4  Total Protein 6.5 - 8.1 g/dL 6.7  Total Bilirubin 0.3 - 1.2 mg/dL  0.3  Alkaline Phos 38 - 126 U/L 213(H)  AST 15 - 41 U/L 31  ALT 0 - 44 U/L 15    Discharge instruction: per After Visit Summary and "Baby and Me Booklet".  After visit meds:  Allergies as of 04/16/2019   No Known Allergies      Medication List     STOP taking these medications    Cholecalciferol 1.25 MG (50000 UT) capsule   cyclobenzaprine 10 MG tablet Commonly known as: FLEXERIL   famotidine 20 MG tablet Commonly known as: PEPCID   promethazine 12.5 MG tablet Commonly known as: PHENERGAN       TAKE these medications    acetaminophen 325 MG tablet Commonly known as: Tylenol Take 2 tablets (650 mg total) by mouth every 4 (four) hours as needed (for pain scale < 4).   albuterol 108 (90 Base) MCG/ACT inhaler Commonly known as: VENTOLIN HFA Inhale 2 puffs into the lungs every 4 (four) hours as needed for wheezing or shortness of breath.   aspirin EC 81 MG tablet Take 1 tablet (81 mg total) by mouth daily. Take after 12 weeks for prevention of preeclampsia later in pregnancy   Blood Pressure Kit 1 Device by Does not apply route once a week. To be monitored weekly at home   ibuprofen 600 MG  tablet Commonly known as: ADVIL Take 1 tablet (600 mg total) by mouth every 6 (six) hours.   multivitamin-prenatal 27-0.8 MG Tabs tablet Take 1 tablet by mouth daily at 12 noon.        Diet: routine diet  Activity: Advance as tolerated. Pelvic rest for 6 weeks.   Outpatient follow up:6 weeks Follow up Appt: Future Appointments  Date Time Provider Guys  04/22/2019 10:00 AM [facility] NURSE [facility]  05/21/2019  1:15 PM Caren Macadam, MD [facility]   Follow up Visit:   Please schedule this patient for Postpartum visit in: 6 weeks with the following provider: Any provider In-Person For C/S patients schedule nurse incision check in weeks 2 weeks: no High risk pregnancy complicated by: PreE without SF Delivery mode:  SVD Anticipated Birth Control:  other/unsure, considering IUD PP Procedures needed: BP check, IUD?  Schedule Integrated BH visit: no  Newborn Data: Live born female  Birth Weight:  2648g APGAR: 60, 9  Newborn Delivery   Birth date/time: 04/14/2019 18:19:00 Delivery type: Vaginal, Spontaneous      Baby Feeding: Breast Disposition:home with mother  Glenna Durand, DO (PGY-1)  GME ATTESTATION:  I saw and evaluated the patient. I agree with the findings and the plan of care as documented in the resident's note.  Merilyn Baba, DO OB Fellow, [place] for [place] 04/16/2019 7:49 AM   04/16/2019 Merilyn Baba, DO

## 2019-04-14 NOTE — Progress Notes (Addendum)
Julie Spears MRN: [id number]  Subjective: -Care assumed of 19 y.o. G1P0 at [redacted]w[redacted]d who presents for IOl s/t GHTN.  However, PC Ratio returns elevated and patient now with diagnosis of PreEclampsia.  Pregnancy and medical history significant for gHTN, CT without negative TOC-Culture pending from 1/28, Teen Pregnancy, Ludwig's Angina, and Asthma.  In room to meet acquaintance of patient and family.  Patient denies HA, visual disturbances, SOB, and RUQ pain.  Endorses fetal movement and denies LOF.    Objective: BP (!) 132/96   Pulse 74   Temp 98.3 F (36.8 C) (Oral)   Resp 16   Ht 5\' 2"  (1.575 m)   Wt 75.8 kg   LMP 07/27/2018   BMI 30.56 kg/m  No intake/output data recorded. No intake/output data recorded.  Results for orders placed or performed during the hospital encounter of 04/14/19 (from the past 24 hour(s))  Comprehensive metabolic panel     Status: Abnormal   Collection Time: 04/14/19  8:19 AM  Result Value Ref Range   Sodium 137 135 - 145 mmol/L   Potassium 4.1 3.5 - 5.1 mmol/L   Chloride 109 98 - 111 mmol/L   CO2 19 (L) 22 - 32 mmol/L   Glucose, Bld 84 70 - 99 mg/dL   BUN 7 6 - 20 mg/dL   Creatinine, Ser 0.71 0.44 - 1.00 mg/dL   Calcium 9.4 8.9 - 10.3 mg/dL   Total Protein 6.7 6.5 - 8.1 g/dL   Albumin 3.0 (L) 3.5 - 5.0 g/dL   AST 31 15 - 41 U/L   ALT 15 0 - 44 U/L   Alkaline Phosphatase 213 (H) 38 - 126 U/L   Total Bilirubin 0.3 0.3 - 1.2 mg/dL   GFR calc non Af Amer >60 >60 mL/min   GFR calc Af Amer >60 >60 mL/min   Anion gap 9 5 - 15  CBC     Status: Abnormal   Collection Time: 04/14/19  8:19 AM  Result Value Ref Range   WBC 11.3 (H) 4.0 - 10.5 K/uL   RBC 4.06 3.87 - 5.11 MIL/uL   Hemoglobin 10.8 (L) 12.0 - 15.0 g/dL   HCT 34.7 (L) 36.0 - 46.0 %   MCV 85.5 80.0 - 100.0 fL   MCH 26.6 26.0 - 34.0 pg   MCHC 31.1 30.0 - 36.0 g/dL   RDW 12.9 11.5 - 15.5 %   Platelets 297 150 - 400 K/uL   nRBC 0.0 0.0 - 0.2 %  Type and screen     Status: None   Collection  Time: 04/14/19  8:19 AM  Result Value Ref Range   ABO/RH(D) B POS    Antibody Screen NEG    Sample Expiration      04/17/2019,2359 Performed at [facility], [address], [place], [place] [address]   Protein / creatinine ratio, urine     Status: Abnormal   Collection Time: 04/14/19  8:35 AM  Result Value Ref Range   Creatinine, Urine 181.60 mg/dL   Total Protein, Urine 103 mg/dL   Protein Creatinine Ratio 0.57 (H) 0.00 - 0.15 mg/mg[Cre]    Fetal Monitoring: FHT: 120 bpm, Mod Var, -Decels, +Accels UC: Q3-83min    Vaginal Exam: SVE:   Dilation: Fingertip Effacement (%): 50 Station: -1 Exam by:: E. Rothermel RN/J. Alvira Monday RN Membranes:Intact Internal Monitors: None  Augmentation/Induction: Pitocin:None Cytotec: S/p One Dose  Assessment:  IUP at 37.1 weeks Cat I FT  PreEclampsia CT Unknown Desires PPIUD  Plan: -  Reviewed POC to include continued cytotec and placement of foley catheter  as appropriate. -Informed that CT remains pending from office visit on 1/28, but after consult with Dr. Jolayne Panther PPIUD should be placed. -Patient without questions or concerns.  -Continue other mgmt as ordered   Valma Cava, CNM Advanced Practice Provider, Center for [facility] 04/14/2019, 10:21 AM

## 2019-04-14 NOTE — Progress Notes (Signed)
LABOR PROGRESS NOTE  Julie Spears is a 19 y.o. G1P0 at [redacted]w[redacted]d  admitted for IOL for PreE w/o SF.  Subjective: Starting to feel contractions more strongly  Objective: BP 140/90   Pulse 77   Temp 97.9 F (36.6 C) (Oral)   Resp 15   Ht 5\' 2"  (1.575 m)   Wt 75.8 kg   LMP 07/27/2018   BMI 30.56 kg/m  or  Vitals:   04/14/19 1405 04/14/19 1527 04/14/19 1602 04/14/19 1604  BP: (!) 130/91 (!) 147/98 140/90 140/90  Pulse: 77 76 77 77  Resp: 15 16  15   Temp:    97.9 F (36.6 C)  TempSrc:    Oral  Weight:      Height:         Dilation: 3 Effacement (%): 90 Cervical Position: Posterior, Middle Station: 0 Presentation: Vertex Exam by:: Dustine Stickler MD FHT: baseline rate 110, moderate varibility, +acel, early and variable decel Toco: q2-3 min  Labs: Lab Results  Component Value Date   WBC 11.3 (H) 04/14/2019   HGB 10.8 (L) 04/14/2019   HCT 34.7 (L) 04/14/2019   MCV 85.5 04/14/2019   PLT 297 04/14/2019    Patient Active Problem List   Diagnosis Date Noted  . [redacted] weeks gestation of pregnancy 04/14/2019  . Hypertension in pregnancy, preeclampsia, third trimester 04/10/2019  . Low serum vitamin D 02/10/2019  . Supervision of other normal pregnancy, antepartum 10/16/2018  . Chlamydia infection affecting pregnancy 10/16/2018  . 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 G1P0 at [redacted]w[redacted]d here for IOL for PreE w/o SF.  Labor: progressing well s/p miso x2 now 3/90/0, will start pitocin.  Fetal Wellbeing:  Cat II for variables however overall reassuring for moderate variability Pain Control:  IV pain meds, epidural upon request GBS: neg Anticipated MOD:  SVD  PreE w/o SF: mild range BP's, asymptomatic, ctm.   [redacted]w[redacted]d, MD/MPH OB Fellow  04/14/2019, 4:50 PM

## 2019-04-15 LAB — GC/CHLAMYDIA PROBE AMP (~~LOC~~) NOT AT ARMC
Chlamydia: NEGATIVE
Comment: NEGATIVE
Comment: NORMAL
Neisseria Gonorrhea: NEGATIVE

## 2019-04-15 MED ORDER — NIFEDIPINE ER OSMOTIC RELEASE 30 MG PO TB24
30.0000 mg | ORAL_TABLET | Freq: Every day | ORAL | Status: DC
  Administered 2019-04-15 – 2019-04-16 (×2): 30 mg via ORAL
  Filled 2019-04-15 (×2): qty 1

## 2019-04-15 NOTE — Lactation Note (Signed)
This note was copied from a baby's chart. Lactation Consultation Note  Patient Name: Julie Spears Date: 04/15/2019 Reason for consult: Initial assessment;Early term 37-38.6wks;Infant < 6lbs;Primapara;1st time breastfeeding  P1 mother whose infant is now 24 hours old.  This is an ETI at 37+1 weeks weighing < 6 lbs.  Mother had baby in the cradle hold on the left breast when I arrived.  Baby was not latched properly and was fussy.  Mother stated she just finished feeding.  Explained feeding cues and had parents observe that baby was still showing cues.  Offered to assist with latching and mother accepted.  Positioned mother appropriately and assisted baby to latch in the football hold on the left breast.  Baby had a wide gape and flanged lips.  She had good jaw extension and began actively sucking.  Demonstrated breast compressions.  Discussed basic breast feeding while observing baby feed for an additional 14 minutes.  Showed mother how to break the suction and remove her after feeding.  Father asked me to show him how to swaddle baby.  Baby swaddled and content.  Offered to initiate the DEBP.  Mother willing to pump.  Pump parts, assembly, disassembly and cleaning reviewed.  Observed mother pumping and the #24 flange size is appropriate at this time.  Mother will feed back any EBM she obtains to baby.  Colostrum container provided and milk storage times reviewed.    Mother does not have a DEBP for home use.  She is planning to apply for [facility] in [facility].  Referral faxed and I asked mother to follow up as soon as possible today to initiate the process.  Mother verbalized understanding.  Father present.  Mom made aware of O/P services, breastfeeding support groups, community resources, and our phone # for post-discharge questions.   RN updated.   Maternal Data Formula Feeding for Exclusion: No Has patient been taught Hand Expression?: Yes Does the patient have breastfeeding  experience prior to this delivery?: No  Feeding Feeding Type: (enc mom to feed, declined help )  LATCH Score                   Interventions    Lactation Tools Discussed/Used WIC Program: Yes Pump Review: Setup, frequency, and cleaning;Milk Storage Initiated by:: Hilaria Titsworth Date initiated:: 04/16/19   Consult Status Consult Status: Follow-up Date: 04/16/19 Follow-up type: In-patient    Julie Spears R Julie Spears 04/15/2019, 11:34 AM

## 2019-04-15 NOTE — Progress Notes (Signed)
Post Partum Day 1 Subjective: no complaints, up ad lib, voiding, tolerating PO and + flatus  Objective: Blood pressure (!) 141/94, pulse 80, temperature 98.6 F (37 C), temperature source Oral, resp. rate 18, height 5\' 2"  (1.575 m), weight 75.8 kg, last menstrual period 07/27/2018, SpO2 100 %, unknown if currently breastfeeding.  Physical Exam:  General: alert, cooperative and no distress Lochia: appropriate Uterine Fundus: firm Incision: NA DVT Evaluation: No evidence of DVT seen on physical exam.  Recent Labs    04/14/19 0819  HGB 10.8*  HCT 34.7*    Assessment/Plan: Plan for discharge tomorrow  BP remains elevated today will start procardia 30XL. Titrate as needed prior to DC home.    LOS: 1 day   06/12/19 DNP, CNM  04/15/19  10:07 AM

## 2019-04-16 MED ORDER — ACETAMINOPHEN 325 MG PO TABS: 650.0000 mg | ORAL_TABLET | ORAL | 0 refills | Status: DC | PRN

## 2019-04-16 MED ORDER — NIFEDIPINE ER 30 MG PO TB24: 30.0000 mg | ORAL_TABLET | Freq: Every day | ORAL | 0 refills | Status: DC

## 2019-04-16 MED ORDER — IBUPROFEN 600 MG PO TABS: 600.0000 mg | ORAL_TABLET | Freq: Four times a day (QID) | ORAL | 0 refills | Status: DC

## 2019-04-16 MED FILL — NIFEdipine ER 30 MG TB24: 30 | 60 days supply | Qty: 60 | Fill #0

## 2019-04-16 NOTE — Discharge Instructions (Signed)

## 2019-04-16 NOTE — Lactation Note (Signed)
This note was copied from a baby's chart. Lactation Consultation Note:  Infant is 38 hours and is a 37+1 week that was at 4% wt loss this am.  Mother has been exclusively breastfeeding.  She experienced some cluster feeding during the night.   Mother reports that she has not pumped her breast since yesterday. She reports that she just didn't like the way it felt.   Reviewed hand expression and obtained several large drops on a spoon and gave to infant.  Discussed importance of pumping and breast stimulation to protect milk supply.  Mother was given a harmony hand pump and assist with pumping. Mother began to obtain large drops with the hand pump. 1 ml was given to infant with a spoon.   LPI guidelines were given to parents. Discussed infants behavior patterns with parents.  Suggested that mother start supplementing infant after each feeding with ebm . Reviewed supplemental guidelines.   Parents receptive to all teaching. Mother to page for [facility] or staff nurse to observe next feeding and to determine if she desires donor milk for supplement.  Informed her staff nurse Kathie Dike that she would be paging .  Patient Name: Julie Spears [id number] Date: 04/16/2019 Reason for consult: Initial assessment   Maternal Data Formula Feeding for Exclusion: No Has patient been taught Hand Expression?: Yes Does the patient have breastfeeding experience prior to this delivery?: No  Feeding Feeding Type: Breast Fed  LATCH Score Latch: Repeated attempts needed to sustain latch, nipple held in mouth throughout feeding, stimulation needed to elicit sucking reflex.  Audible Swallowing: None  Type of Nipple: Everted at rest and after stimulation  Comfort (Breast/Nipple): Filling, red/small blisters or bruises, mild/mod discomfort  Hold (Positioning): Assistance needed to correctly position infant at breast and maintain latch.  LATCH Score: 5  Interventions Interventions: Breast feeding basics  reviewed;Assisted with latch;Breast compression;Skin to skin;Adjust position;Support pillows;Hand express;Position options;Expressed milk  Lactation Tools Discussed/Used WIC Program: No   Consult Status Follow-up type: In-patient    Stevan Born [facility] 04/16/2019, 11:17 AM

## 2019-04-17 ENCOUNTER — Ambulatory Visit: Payer: Self-pay

## 2019-04-17 NOTE — Lactation Note (Signed)
This note was copied from a baby's chart. Lactation Consultation Note:  Infant is 26 hours old. Mother reports that infant is breastfeeding well.  Mother has been using hand pump and has been giving infant unmeasured amts of ebm with her finger.  Mother has approx 30 ml of ebm at the bedside.  Mother was given a curved tip syringe and taught how to use to offer breast milk as supplement.   Encouraged mother to continue to give ebm after each feeding.  Mother prefers the hand pump over the DEBP. Discussed importance of making sure that infant breastfeeds well and softens breast. Mother reports that her breast are getting firm and full. She has been massaging and plans to ice her breast.   Encouraged mother to page for [facility] when infant breastfeeds again. Mother is aware that she can phone [facility] office as needed for questions or concerns.  Mother is active with [facility] but doesn't plan to get a DEBP. Lots of praise and support given to mother.   Patient Name: Julie Spears Date: 04/17/2019 Reason for consult: Follow-up assessment   Maternal Data    Feeding Feeding Type: Breast Fed  LATCH Score                   Interventions Interventions: Skin to skin;Breast massage;Expressed milk;Comfort gels  Lactation Tools Discussed/Used     Consult Status Consult Status: Complete    Michel Bickers 04/17/2019, 8:35 AM

## 2019-04-22 ENCOUNTER — Other Ambulatory Visit: Payer: Self-pay

## 2019-04-22 ENCOUNTER — Ambulatory Visit (INDEPENDENT_AMBULATORY_CARE_PROVIDER_SITE_OTHER): Payer: Medicaid Other | Admitting: *Deleted

## 2019-04-22 VITALS — BP 137/95

## 2019-04-22 DIAGNOSIS — O1493 Unspecified pre-eclampsia, third trimester: Secondary | ICD-10-CM

## 2019-04-22 NOTE — Progress Notes (Signed)
I connected with  Julie Spears on 04/22/19 at 10:00 AM EST by telephone and verified that I am speaking with the correct person using two identifiers.   I discussed the limitations, risks, security and privacy concerns of performing an evaluation and management service by telephone and the availability of in person appointments. I also discussed with the patient that there may be a patient responsible charge related to this service. The patient expressed understanding and agreed to proceed.  Scheryl Marten, RN 04/22/2019  1:05 PM   Subjective:  Julie Spears is a 19 y.o. female  On the phone for BP check.   Hypertension ROS: taking medications as instructed, no medication side effects noted, no TIA's, no chest pain on exertion, no dyspnea on exertion and no swelling of ankles.    Objective:  BP (!) 137/95   LMP 07/27/2018    General exam BP noted to be well controlled today at home.    Assessment:   Blood Pressure stable.   Plan:  Current treatment plan is effective, no change in therapy.Marland Kitchen

## 2019-04-22 NOTE — Progress Notes (Signed)
Patient seen and assessed by nursing staff.  Agree with documentation and plan.  

## 2019-05-21 ENCOUNTER — Ambulatory Visit (INDEPENDENT_AMBULATORY_CARE_PROVIDER_SITE_OTHER): Payer: Medicaid Other | Admitting: Family Medicine

## 2019-05-21 ENCOUNTER — Other Ambulatory Visit: Payer: Self-pay

## 2019-05-21 ENCOUNTER — Encounter: Payer: Self-pay | Admitting: Family Medicine

## 2019-05-21 DIAGNOSIS — O1493 Unspecified pre-eclampsia, third trimester: Secondary | ICD-10-CM | POA: Diagnosis not present

## 2019-05-21 DIAGNOSIS — Z1389 Encounter for screening for other disorder: Secondary | ICD-10-CM | POA: Diagnosis not present

## 2019-05-21 DIAGNOSIS — F53 Postpartum depression: Secondary | ICD-10-CM | POA: Diagnosis not present

## 2019-05-21 DIAGNOSIS — O99345 Other mental disorders complicating the puerperium: Secondary | ICD-10-CM | POA: Diagnosis not present

## 2019-05-21 MED ORDER — HYDROXYZINE HCL 25 MG PO TABS: 25.0000 mg | ORAL_TABLET | Freq: Every evening | ORAL | 2 refills | Status: AC | PRN

## 2019-05-21 MED ORDER — SERTRALINE HCL 25 MG PO TABS: 25.0000 mg | ORAL_TABLET | Freq: Every day | ORAL | 2 refills | Status: DC

## 2019-05-21 NOTE — Patient Instructions (Signed)

## 2019-05-21 NOTE — Progress Notes (Signed)
Post Partum Exam  Julie Spears is a 19 y.o. G26P1001 female who presents for a postpartum visit. She is 5 weeks postpartum following a  Vaginal delivery: I have fully reviewed the prenatal and intrapartum course. The delivery was at 37.1 gestational weeks.  Anesthesia: none. Postpartum course has been uncomplicated. Baby's course has been uncomplicated. Baby is feeding by breast. Bleeding no bleeding. Bowel function is normal. Bladder function is normal. Patient is not sexually active. Contraception method is undecided. Postpartum depression screening: POSITIVE  Edinburgh Postnatal Depression Scale - 05/21/19 1317      Edinburgh Postnatal Depression Scale:  In the Past 7 Days   I have been able to laugh and see the funny side of things.  2    I have looked forward with enjoyment to things.  2    I have blamed myself unnecessarily when things went wrong.  2    I have been anxious or worried for no good reason.  2    I have felt scared or panicky for no good reason.  2    Things have been getting on top of me.  2    I have been so unhappy that I have had difficulty sleeping.  2    I have felt sad or miserable.  3    I have been so unhappy that I have been crying.  2    The thought of harming myself has occurred to me.  0    Edinburgh Postnatal Depression Scale Total  19      The following portions of the patient's history were reviewed and updated as appropriate: allergies, current medications, past family history, past medical history, past social history, past surgical history and problem list. Last pap smear done @21   Review of Systems Pertinent items are noted in HPI.    Objective:  Blood pressure (!) 138/96, pulse 75, weight 147 lb (66.7 kg), last menstrual period 07/27/2018, unknown if currently breastfeeding.  General:  alert and tearful   Breasts:  no concerns  Lungs: NWOB  Heart:  RR  Abdomen: soft, non-tender; bowel sounds normal; no masses,  no organomegaly   Vulva:  not  evaluated  Vagina: not evaluated  Cervix:  not evaluated  Corpus: not examined  Adnexa:  not evaluated  Rectal Exam: Not performed.        Assessment:   Normal postpartum exam. Pap smear not done at today's visit.   Plan:   1. Contraception: IUD- reviewed today and patient would like to defer until next appt. Not sexually active and discussed she should use condoms with all sex until method started.  2. Infant feeding= exclusively breastfeeding!!  3. 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   Reviewed starting medication and patient agrees today. Start zoloft 25mg  increase to 50mg  in 1 week.   Referral to behavioral health  We discussed the importance of 4 hr of consistent sleep.   Given rx for atarax for anxiety abut especially for use at night to help with racing thoughts and encourage sleep.   Encouraged her to advocate with partner for 4 hrs of consistent sleep to help mood stabilization.   4.  Chronic medical- had HTN in pregnancy. Will recheck BP in 4 wks. No s/sx today.    Follow up in: 4 weeks depression  Future Appointments  Date Time Provider Department Center  05/26/2019  2:45 PM [facility]INICIAN [facility]  06/18/2019  2:00 PM 05/28/2019, MD [facility]  [facility]

## 2019-05-26 ENCOUNTER — Institutional Professional Consult (permissible substitution): Payer: Medicaid Other

## 2019-05-27 ENCOUNTER — Ambulatory Visit: Payer: Medicaid Other | Admitting: Licensed Clinical Social Worker

## 2019-05-27 DIAGNOSIS — Z91199 Patient's noncompliance with other medical treatment and regimen due to unspecified reason: Secondary | ICD-10-CM

## 2019-05-27 DIAGNOSIS — Z5329 Procedure and treatment not carried out because of patient's decision for other reasons: Secondary | ICD-10-CM

## 2019-05-27 NOTE — BH Specialist Note (Signed)
Called pt twice. Left detail voicemail regarding scheduled visit. Patient no show appt

## 2019-06-18 ENCOUNTER — Encounter: Payer: Self-pay | Admitting: Family Medicine

## 2019-06-18 ENCOUNTER — Ambulatory Visit (INDEPENDENT_AMBULATORY_CARE_PROVIDER_SITE_OTHER): Payer: Medicaid Other | Admitting: Family Medicine

## 2019-06-18 ENCOUNTER — Other Ambulatory Visit: Payer: Self-pay

## 2019-06-18 DIAGNOSIS — O99345 Other mental disorders complicating the puerperium: Secondary | ICD-10-CM

## 2019-06-18 DIAGNOSIS — Z1389 Encounter for screening for other disorder: Secondary | ICD-10-CM | POA: Diagnosis not present

## 2019-06-18 DIAGNOSIS — F53 Postpartum depression: Secondary | ICD-10-CM

## 2019-06-18 MED ORDER — SERTRALINE HCL 50 MG PO TABS: 50.0000 mg | ORAL_TABLET | Freq: Every day | ORAL | 11 refills | Status: AC

## 2019-06-18 NOTE — Progress Notes (Signed)
Pt states she is feeling better  does want to wait on IUD today

## 2019-06-18 NOTE — Progress Notes (Signed)
   Mood PROBLEM  VISIT ENCOUNTER NOTE  Subjective:   Julie Spears is a 19 y.o. G68P1001 female here for a mood check   Current complaints: none  Still nursing exclusively, feeling much better and spending time outside. Reports no crying episodes since starting medication. Talking about daughter and sharing joys.  Feels "more stable" and still has "bad day"  Edinburgh Postnatal Depression Scale - 06/18/19 1404      Edinburgh Postnatal Depression Scale:  In the Past 7 Days   I have been able to laugh and see the funny side of things.  0    I have looked forward with enjoyment to things.  0    I have blamed myself unnecessarily when things went wrong.  1    I have been anxious or worried for no good reason.  1    I have felt scared or panicky for no good reason.  0    Things have been getting on top of me.  1    I have been so unhappy that I have had difficulty sleeping.  1    I have felt sad or miserable.  2    I have been so unhappy that I have been crying.  1    The thought of harming myself has occurred to me.  0    Edinburgh Postnatal Depression Scale Total  7        Denies abnormal vaginal bleeding, discharge, pelvic pain, problems with intercourse or other gynecologic concerns.    Gynecologic History No LMP recorded. Contraception: condoms  There are no preventive care reminders to display for this patient.   The following portions of the patient's history were reviewed and updated as appropriate: allergies, current medications, past family history, past medical history, past social history, past surgical history and problem list.  Review of Systems Pertinent items are noted in HPI.   Objective:  BP 129/86   Pulse 88  Gen: well appearing, NAD HEENT: no scleral icterus CV: RR Lung: Normal WOB Ext: warm well perfused Pysch: interactive and making great eye contact. Smiling and holding baby.    Assessment and Plan:   1. Postpartum depression Much improved PPD  with reduction from 19 to 7 which is a 50% reduction in symptoms since last visit - Plan to continue zoloft for at least 6 months at current dose. Patient will contact office if sx worsen - If patient desires taper off at 6 months counseled to make appointment - sertraline (ZOLOFT) 50 MG tablet; Take 1 tablet (50 mg total) by mouth daily. Increase to 2 pills daily (50mg ) in 1 week if no serious side effects.  Dispense: 30 tablet; Refill: 11  2. Contraception - Desires pregnancy in next 3-5 years, wants to space out her children - Still thinking about IUD - Counseled about regular condom use - Reviewed briefly other methods  Please refer to After Visit Summary for other counseling recommendations.   Return in about 2 weeks (around 07/02/2019) for IUD insertion.  07/04/2019, MD, MPH, ABFM Attending Physician Faculty Practice- Center for [facility]

## 2019-07-16 ENCOUNTER — Ambulatory Visit: Payer: Medicaid Other | Admitting: Family Medicine

## 2021-06-20 IMAGING — US OBSTETRIC <14 WK US AND TRANSVAGINAL OB US
1 series · 15 of 28 positions shown · non-contrast
Comparison: None.

CLINICAL DATA: First trimester of pregnancy, pelvic pain.

EXAM:
OBSTETRIC <14 WK US AND TRANSVAGINAL OB US
TECHNIQUE: Both transabdominal and transvaginal ultrasound examinations were
performed for complete evaluation of the gestation as well as the
maternal uterus, adnexal regions, and pelvic cul-de-sac.
Transvaginal technique was performed to assess early pregnancy.

[Series 1: obstetric <14 wk us and transvaginal ob us · 15 of 43 slices shown]
[im 1/43]
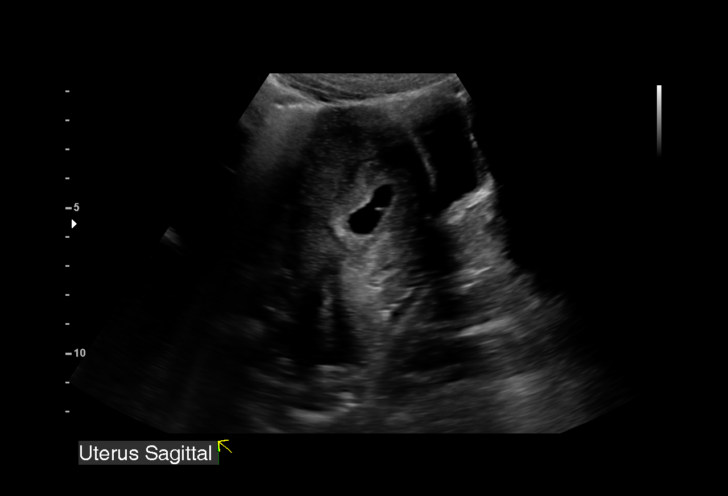
[im 4/43]
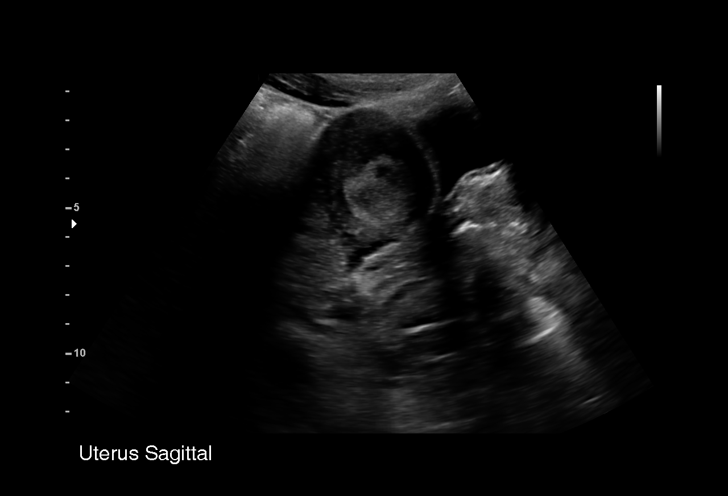
[im 7/43]
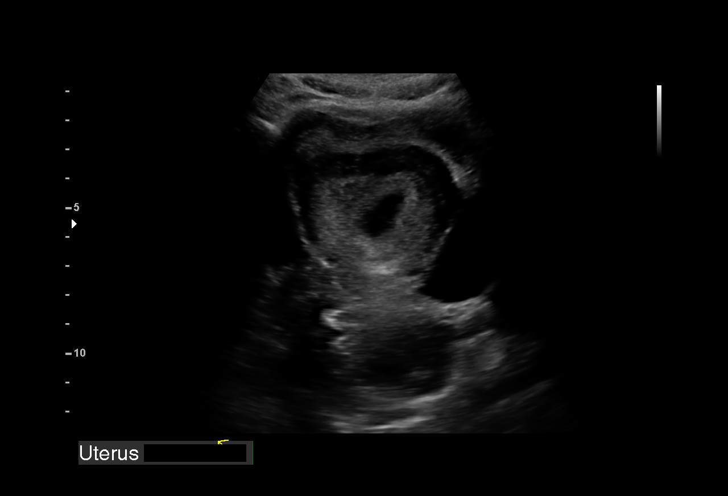
[im 10/43]
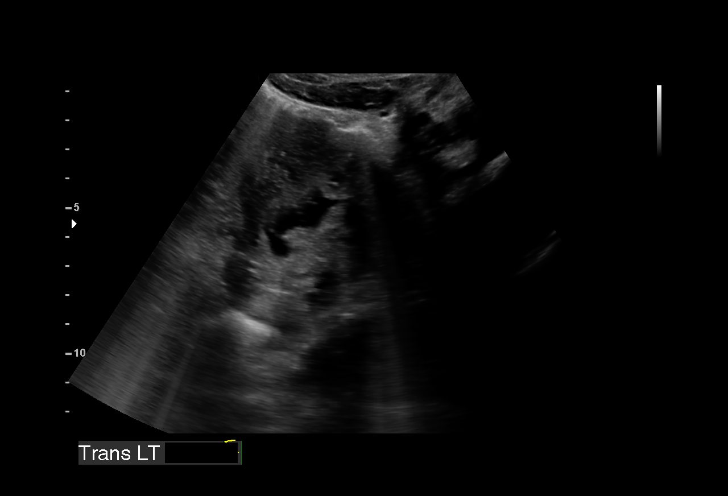
[im 13/43]
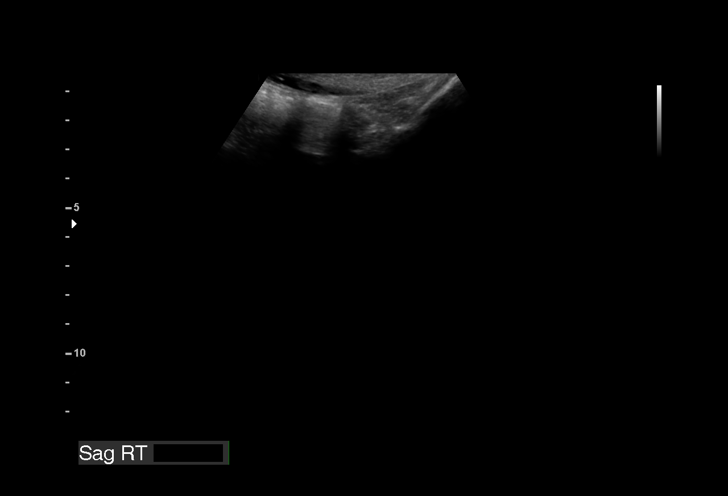
[im 16/43]
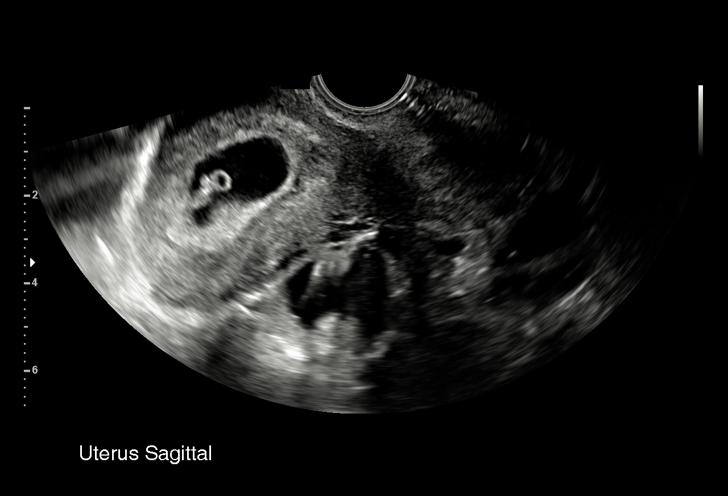
[im 19/43]
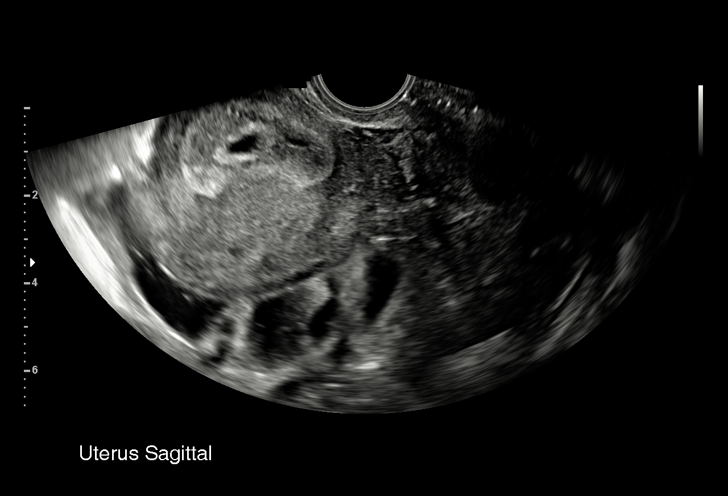
[im 22/43]
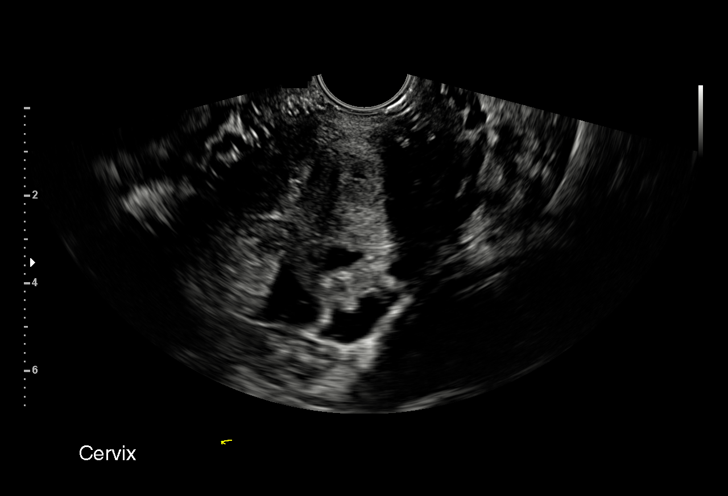
[im 24/43]
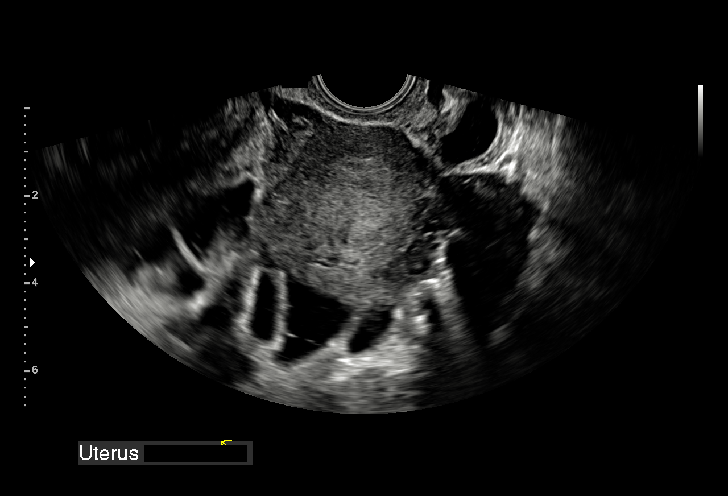
[im 27/43]
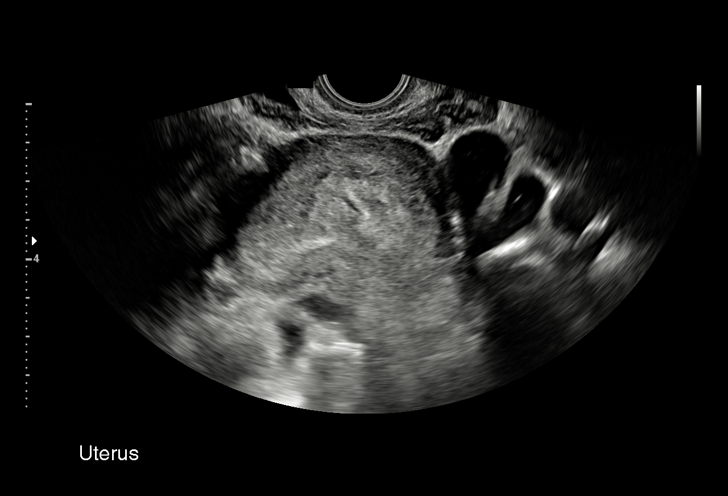
[im 30/43]
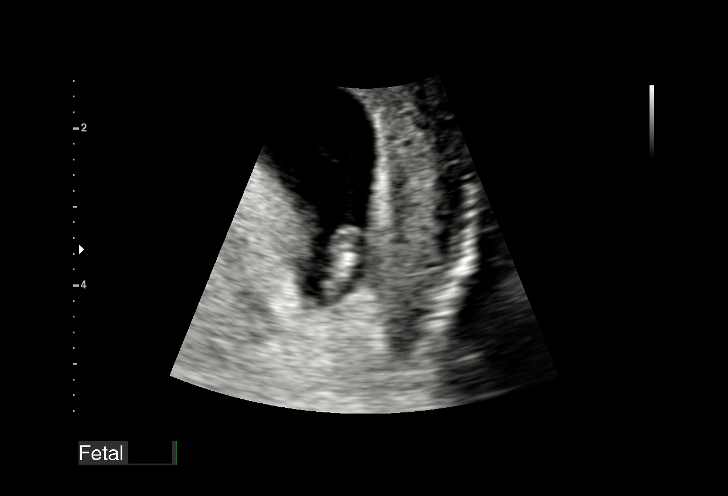
[im 33/43]
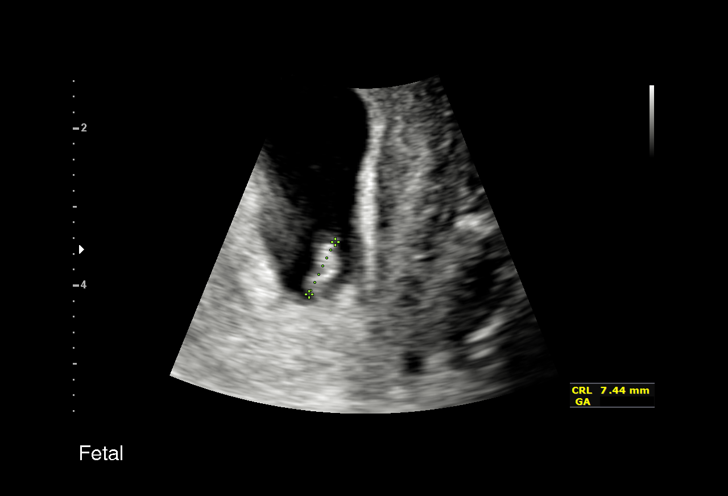
[im 36/43]
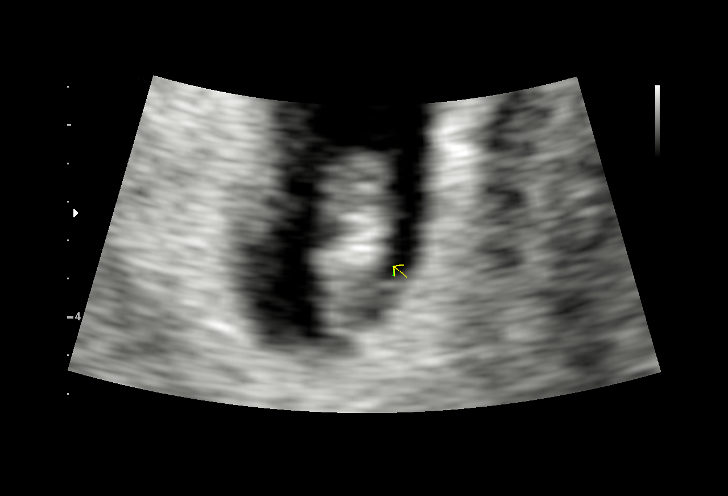
[im 39/43]
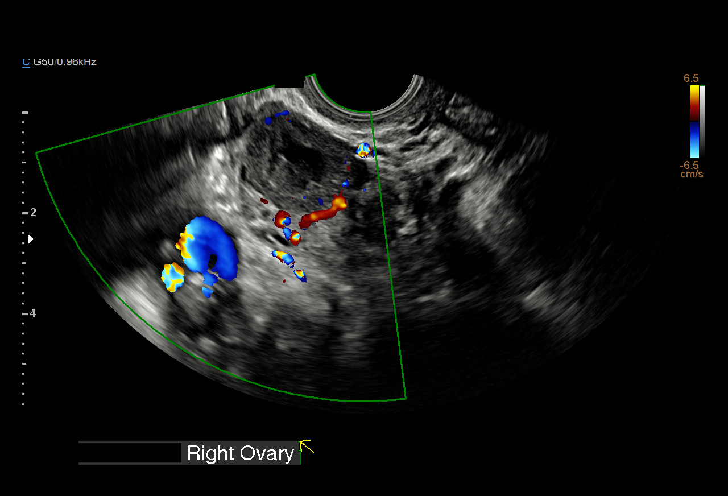
[im 43/43]
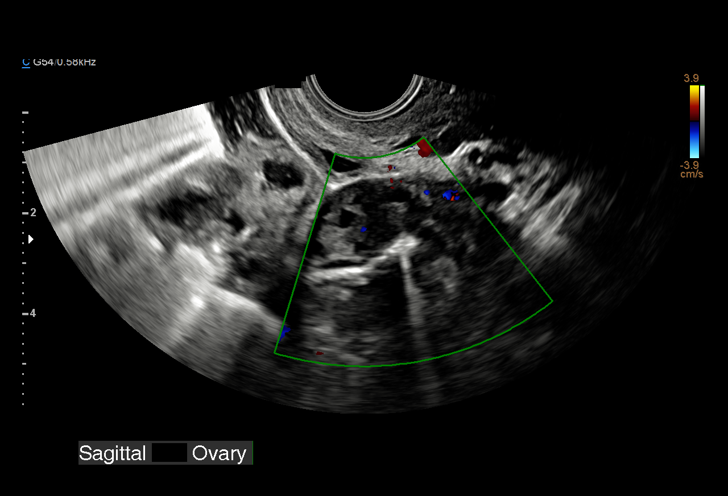

[15 of 28 positions shown; findings below may reference images not displayed]

FINDINGS: Intrauterine gestational sac: Single visualized.

Yolk sac:  Visualized.

Embryo:  Visualized.

Cardiac Activity: Visualized.

Heart Rate: 114 bpm

CRL: 7.66 mm 6 w 4 d US EDC: May 04, 2019.

Subchorionic hemorrhage:  None visualized.

Maternal uterus/adnexae: Probable corpus luteum cyst seen in right
ovary. Left ovary appears normal. No free fluid is noted.
IMPRESSION: Single live intrauterine gestation of 6 weeks 4 days.

## 2021-09-16 IMAGING — US US MFM OB COMP +14 WKS
1 series · 12 of 28 positions shown · non-contrast
Comparison: none

[Series 1: us mfm ob comp +14 wks · 43 acquisitions, 12 frames shown]
[im 2/43]
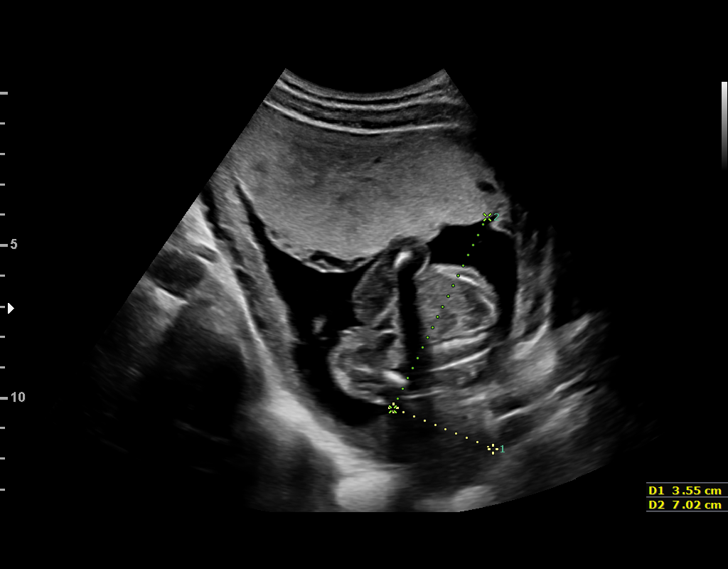
[im 5/43]
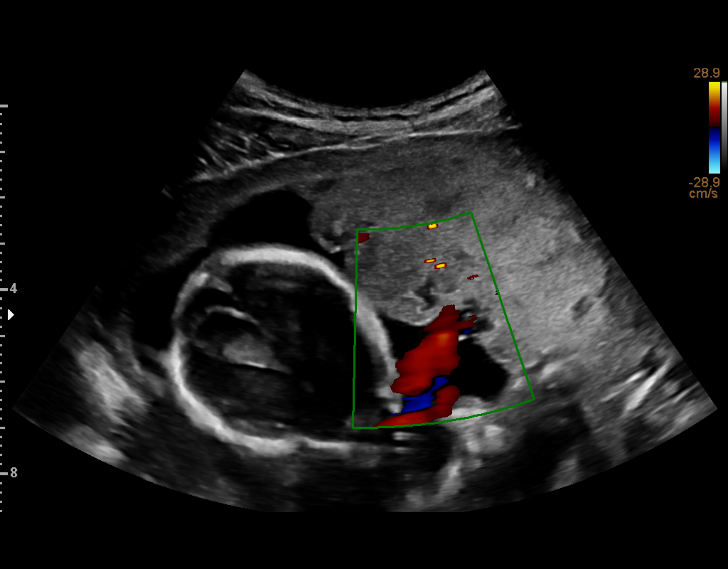
[im 8/43]
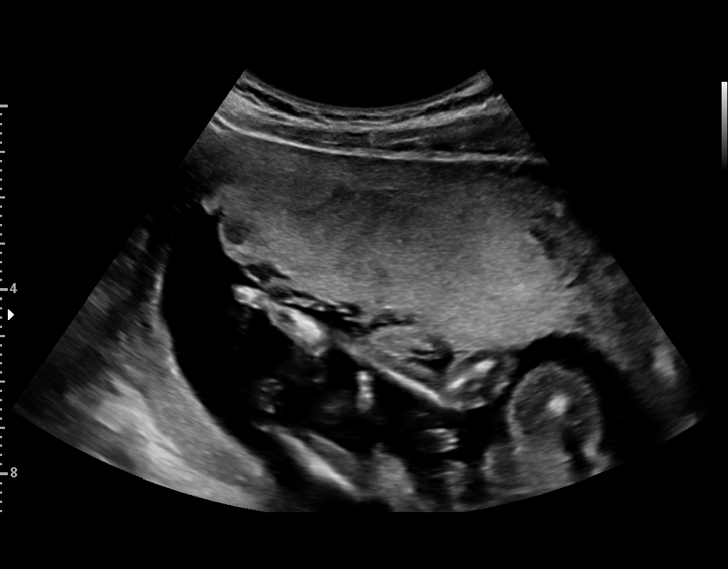
[im 13/43]
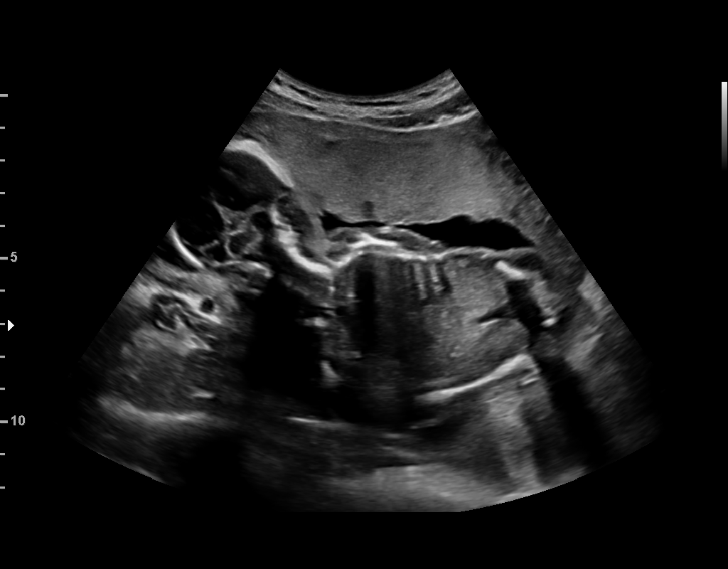
[im 16/43]
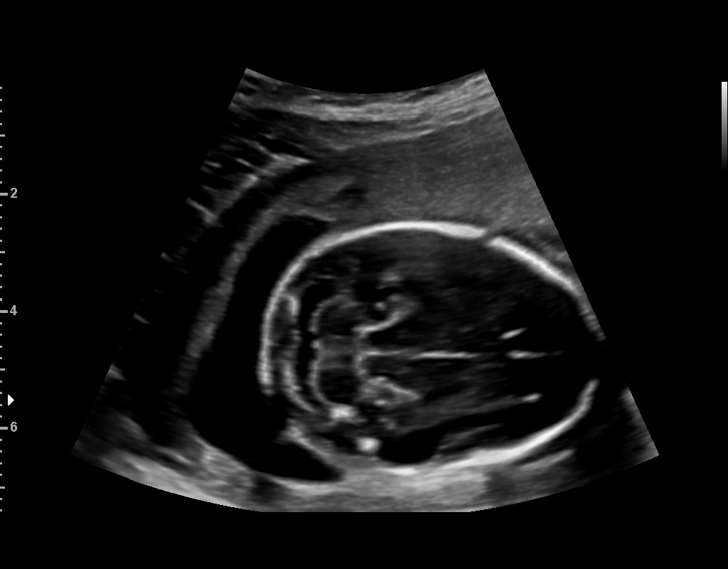
[im 19/43]
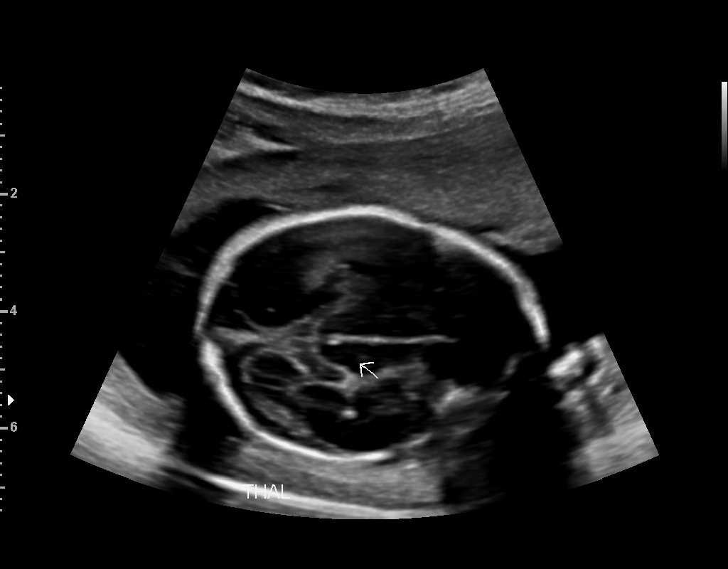
[im 24/43]
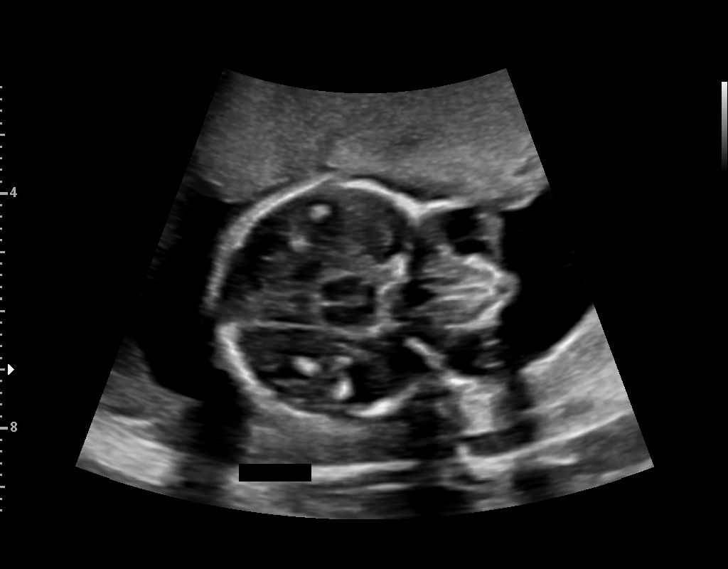
[im 27/43]
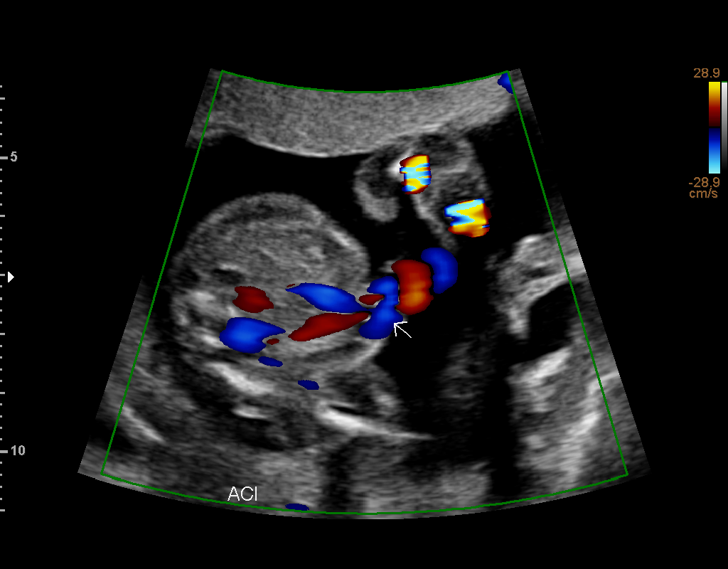
[im 30/43]
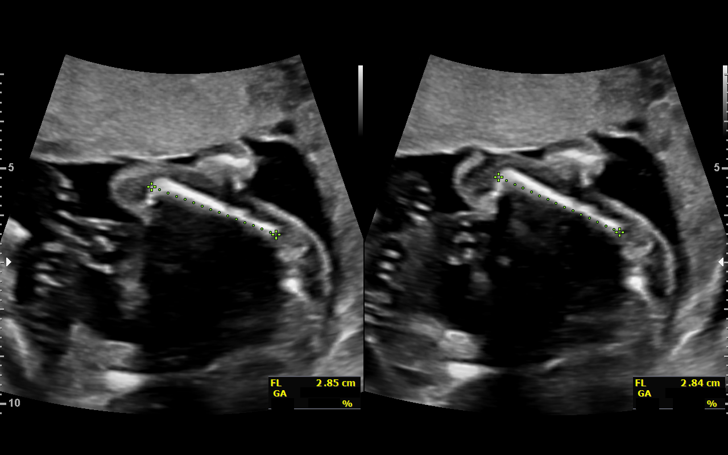
[im 35/43]
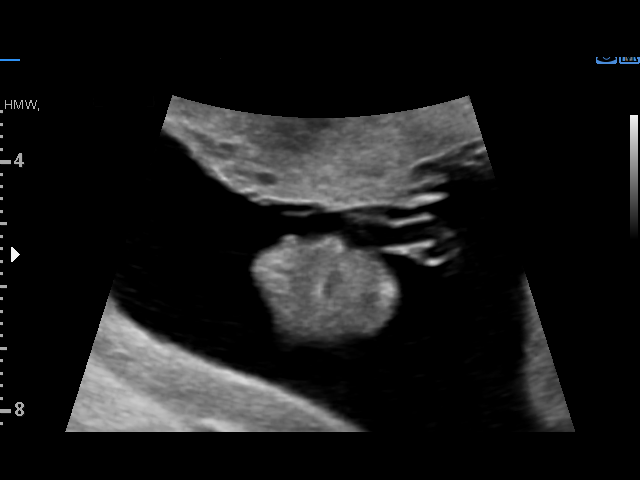
[im 38/43]
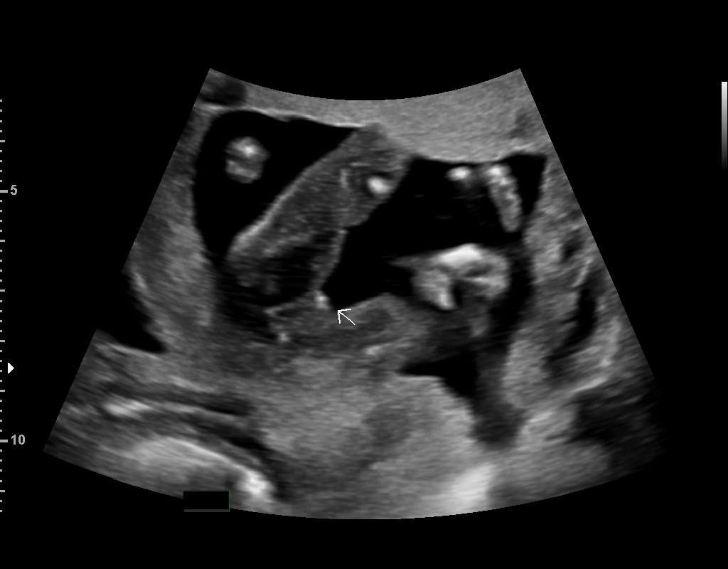
[im 41/43]
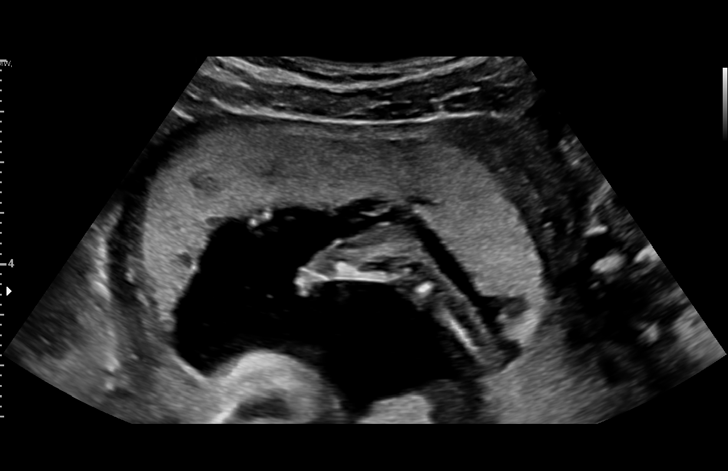

[12 of 28 positions shown; findings below may reference images not displayed]

BRYAN GEOVANNY
 ----------------------------------------------------------------------

 ----------------------------------------------------------------------
Indications

  19 weeks gestation of pregnancy
  Encounter for antenatal screening for
  malformations (declined testing)
 ----------------------------------------------------------------------
Fetal Evaluation

 Num Of Fetuses:          1
 Fetal Heart Rate(bpm):   140
 Cardiac Activity:        Observed
 Presentation:            Breech
 Placenta:                Fundal
 P. Cord Insertion:       Visualized, central

 Amniotic Fluid
 AFI FV:      Within normal limits

                             Largest Pocket(cm)

Biometry

 BPD:      42.9  mm     G. Age:  19w 0d         43  %    CI:        71.54   %    70 - 86
                                                         FL/HC:       17.6  %    16.1 -
 HC:      161.5  mm     G. Age:  19w 0d         33  %    HC/AC:       1.14       1.09 -
 AC:      141.3  mm     G. Age:  19w 3d         58  %    FL/BPD:      66.2  %
 FL:       28.4  mm     G. Age:  18w 5d         28  %    FL/AC:       20.1  %    20 - 24
 HUM:      28.3  mm     G. Age:  19w 1d         50  %
 CER:      19.3  mm     G. Age:  18w 5d         37  %
 NFT:       3.9  mm
 CM:        3.9  mm

 Est. FW:     274   gm   0 lb 10 oz      43  %
OB History

 Gravidity:    1         Term:   0        Prem:   0        SAB:   0
 TOP:          0       Ectopic:  0        Living: 0
Gestational Age

 LMP:           19w 2d        Date:  07/27/18                 EDD:   05/03/19
 U/S Today:     19w 0d                                        EDD:   05/05/19
 Best:          19w 1d     Det. By:  Early Ultrasound         EDD:   05/04/19
                                     (09/12/18)
Anatomy

 Cranium:               Appears normal         Aortic Arch:            Not well visualized
 Cavum:                 Appears normal         Ductal Arch:            Not well visualized
 Ventricles:            Appears normal         Diaphragm:              Not well visualized
 Choroid Plexus:        Appears normal         Stomach:                Appears normal, left
                                                                       sided
 Cerebellum:            Appears normal         Abdomen:                Appears normal
 Posterior Fossa:       Appears normal         Abdominal Wall:         Appears nml (cord
                                                                       insert, abd wall)
 Nuchal Fold:           Appears normal         Cord Vessels:           Appears normal (3
                                                                       vessel cord)
 Face:                  Appears normal         Kidneys:                Not well visualized
                        (orbits and profile)
 Lips:                  Appears normal         Bladder:                Appears normal
 Thoracic:              Appears normal         Spine:                  Not well visualized
 Heart:                 Not well visualized    Upper Extremities:      Appears normal
 RVOT:                  Not well visualized    Lower Extremities:      Appears normal
 LVOT:                  Not well visualized

 Other:  Technically difficult due to fetal position. Nasal bone visualized. Lt
         hand, 5th and heel visualized.
Cervix Uterus Adnexa

 Cervix
 Length:            3.6  cm.
 Normal appearance by transabdominal scan.

 Uterus
 No abnormality visualized.

 Left Ovary
 No adnexal mass visualized.

 Right Ovary
 No adnexal mass visualized.

 Cul De Sac
 No free fluid seen.

 Adnexa
 No abnormality visualized.
Myomas

  Site                     L(cm)      W(cm)      D(cm)       Location
  L
 ----------------------------------------------------------------------
  Blood Flow                 RI        PI       Comments

 ----------------------------------------------------------------------
Impression

 Normal interval growth.  No ultrasonic evidence of structural
 fetal anomalies.
 Suboptimal views of the fetal anatomy was obtatined
 secondary to fetal position.
Recommendations

 Follow up growth in 4 weeks.

## 2021-10-14 IMAGING — US US MFM OB FOLLOW-UP
1 series · 14 of 28 positions shown · non-contrast
Comparison: none

[Series 1: us mfm ob follow-up · 80 acquisitions, 14 frames shown]
[im 3/80]
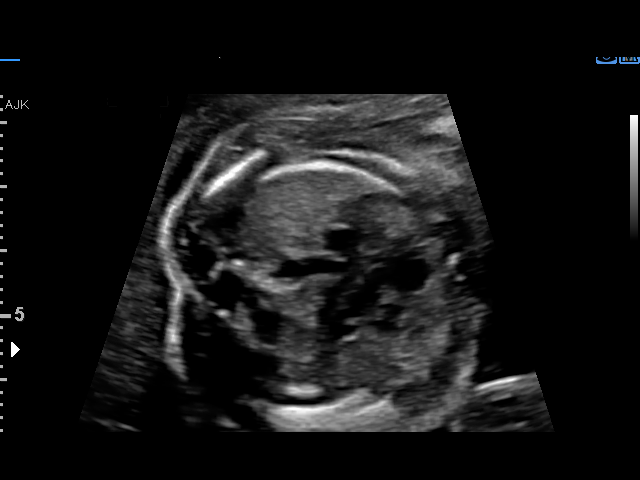
[im 9/80]
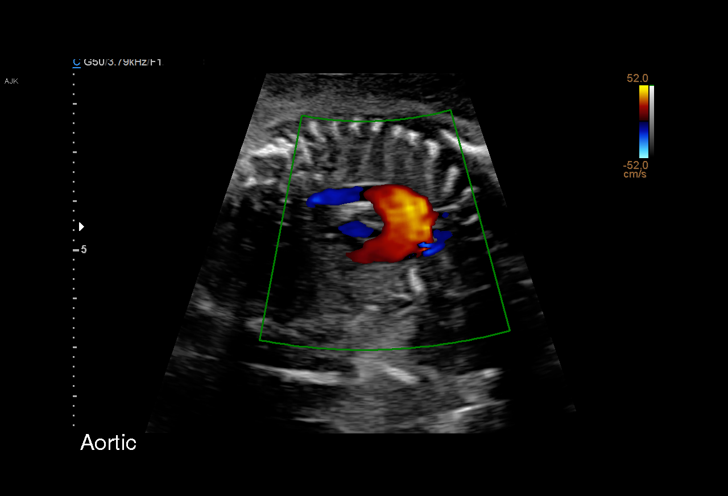
[im 15/80]
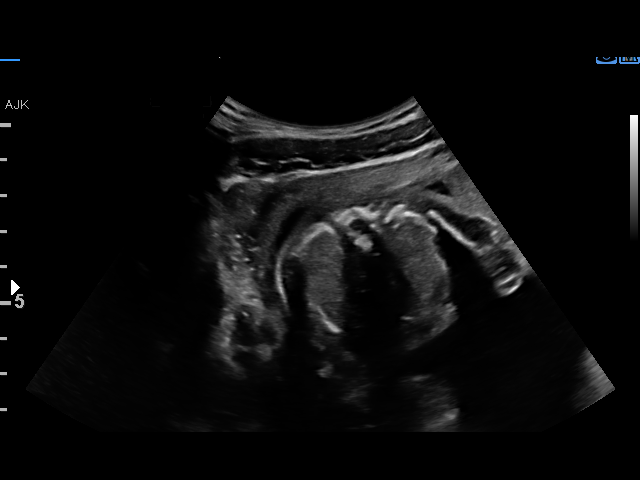
[im 21/80]
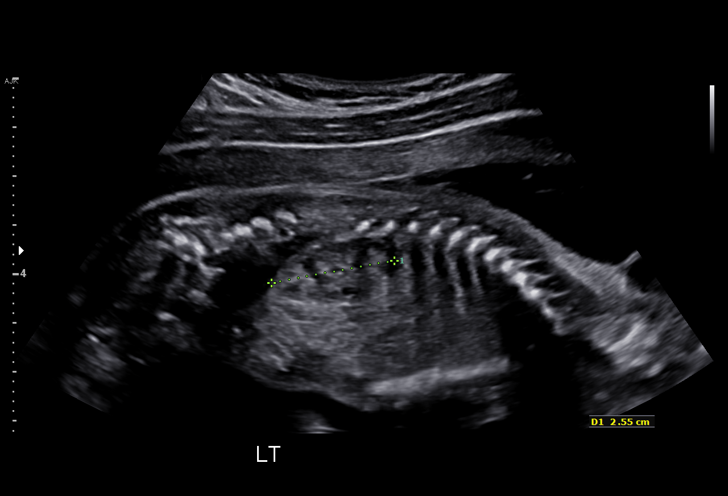
[im 27/80]
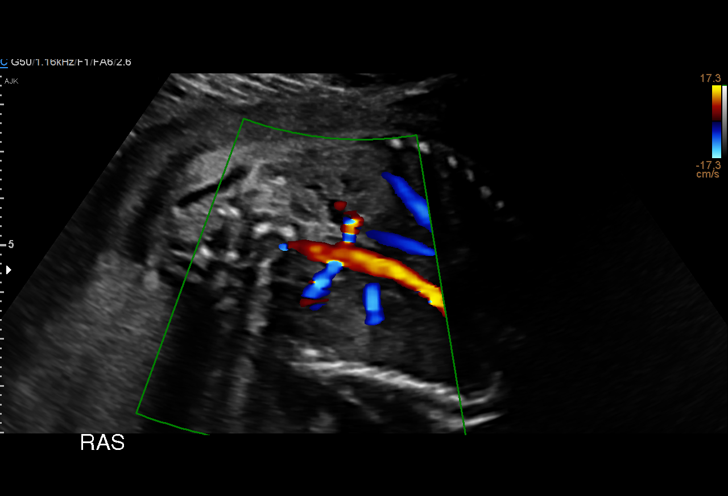
[im 33/80]
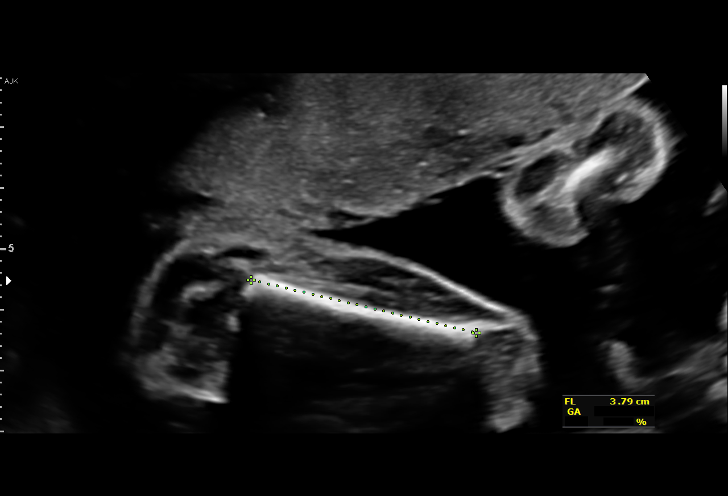
[im 39/80]
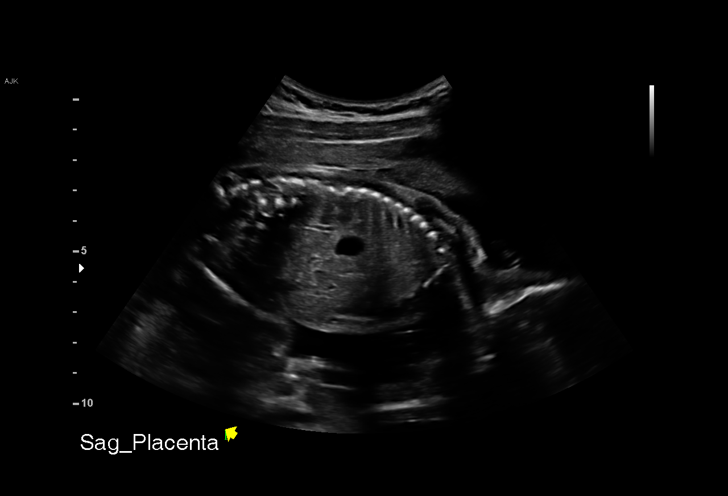
[im 44/80]
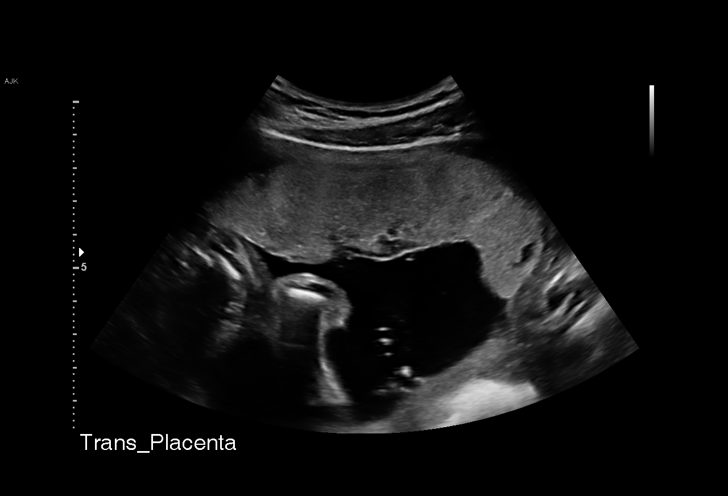
[im 50/80]
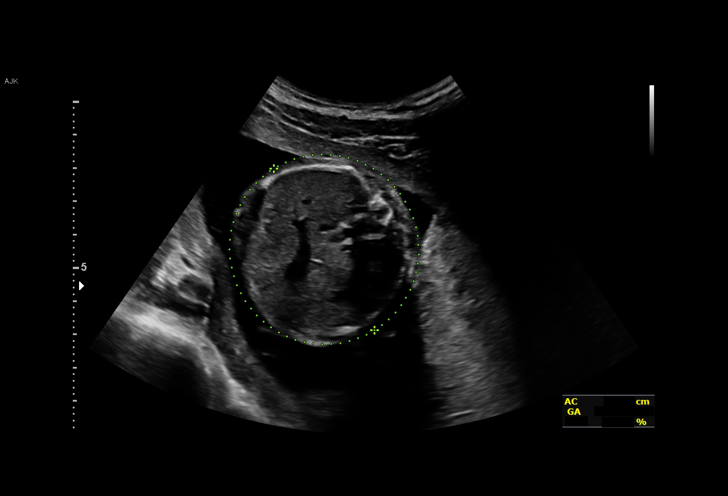
[im 56/80]
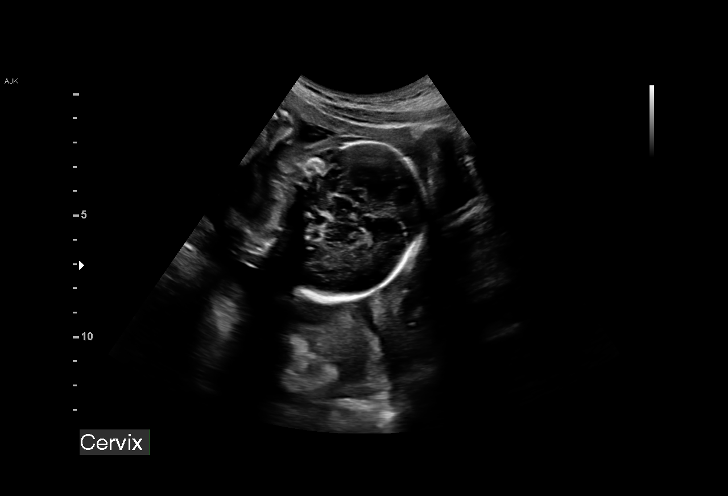
[im 62/80]
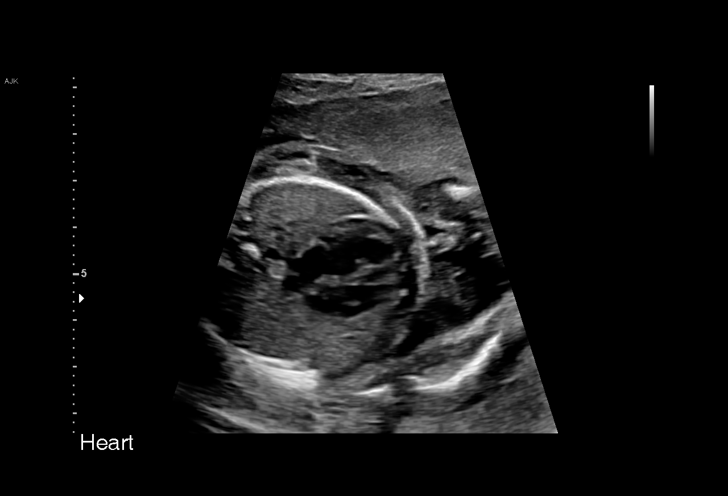
[im 68/80]
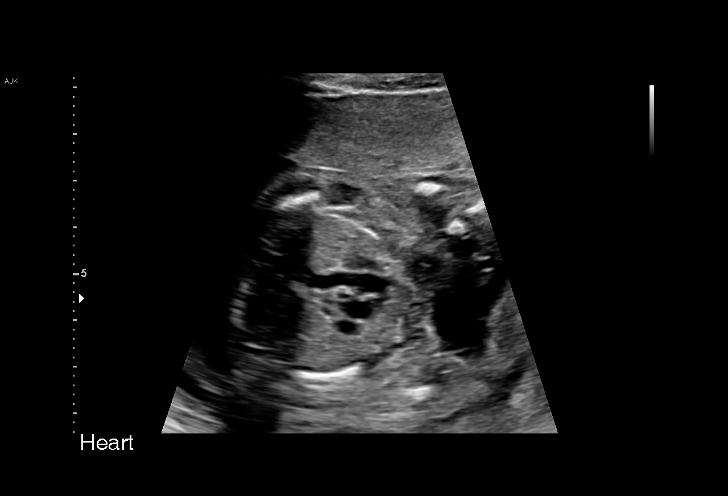
[im 74/80]
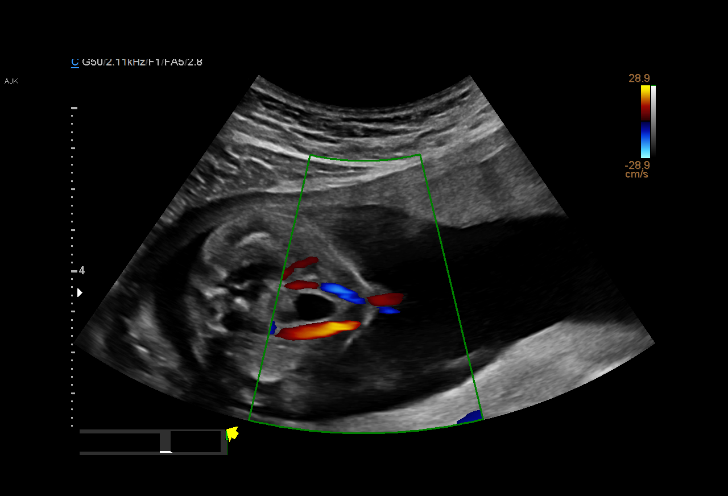
[im 80/80]
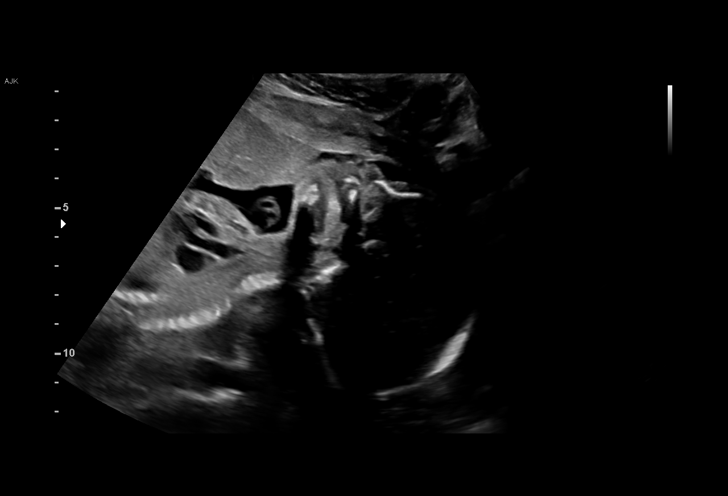

[14 of 28 positions shown; findings below may reference images not displayed]

SAMOURA
 ----------------------------------------------------------------------

 ----------------------------------------------------------------------
Indications

  Encounter for other antenatal screening
  follow-up
  23 weeks gestation of pregnancy
 ----------------------------------------------------------------------
Fetal Evaluation

 Num Of Fetuses:         1
 Fetal Heart Rate(bpm):  129
 Cardiac Activity:       Observed
 Presentation:           Cephalic
 Placenta:               Anterior
 P. Cord Insertion:      Visualized

 Amniotic Fluid
 AFI FV:      Within normal limits

                             Largest Pocket(cm)

Biometry

 BPD:      56.6  mm     G. Age:  23w 2d         51  %    CI:        73.42   %    70 - 86
                                                         FL/HC:      18.5   %    19.2 -
 HC:      209.9  mm     G. Age:  23w 0d         32  %    HC/AC:      1.18        1.05 -
 AC:      178.3  mm     G. Age:  22w 5d         28  %    FL/BPD:     68.6   %    71 - 87
 FL:       38.8  mm     G. Age:  22w 3d         18  %    FL/AC:      21.8   %    20 - 24
 HUM:      37.9  mm     G. Age:  23w 2d         46  %

 Est. FW:     524  gm      1 lb 2 oz     22  %
OB History

 Gravidity:    1         Term:   0        Prem:   0        SAB:   0
 TOP:          0       Ectopic:  0        Living: 0
Gestational Age

 LMP:           23w 2d        Date:  07/27/18                 EDD:   05/03/19
 U/S Today:     22w 6d                                        EDD:   05/06/19
 Best:          23w 1d     Det. By:  Early Ultrasound         EDD:   05/04/19
                                     (09/12/18)
Anatomy

 Cranium:               Appears normal         Aortic Arch:            Appears normal
 Cavum:                 Previously seen        Ductal Arch:            limited views normal
 Ventricles:            Previously seen        Diaphragm:              Appears normal
 Choroid Plexus:        Previously seen        Stomach:                Appears normal, left
                                                                       sided
 Cerebellum:            Previously seen        Abdomen:                Appears normal
 Posterior Fossa:       Previously seen        Abdominal Wall:         Appears nml (cord
                                                                       insert, abd wall)
 Nuchal Fold:           Previously seen        Cord Vessels:           Appears normal (3
                                                                       vessel cord)
 Face:                  Orbits and profile     Kidneys:                Appear normal
                        previously seen
 Lips:                  Previously seen        Bladder:                Appears normal
 Thoracic:              Appears normal         Spine:                  Appears normal
 Heart:                 Appears normal         Upper Extremities:      Previously seen
                        (4CH, axis, and
                        situs)
 RVOT:                  Appears normal         Lower Extremities:      Previously seen
 LVOT:                  Appears normal

 Other:  Technically difficult due to fetal position. Nasal bone visualized. Lt
         hand, 5th and heel visualized.
Cervix Uterus Adnexa

 Cervix
 Length:           3.57  cm.
 Normal appearance by transabdominal scan.

 Uterus
 No abnormality visualized.

 Left Ovary
 Within normal limits.

 Right Ovary
 Within normal limits.

 Adnexa
 No abnormality visualized.
Impression

 Patient returned for completion of fetal anatomy. Amniotic
 fluid is normal and good fetal activity is seen. Fetal growth is
 appropriate for gestational age. Fetal anatomical survey was
 completed and appears normal.
Recommendations

 Follow-up scans as clinically indicated.
                 Olivarria, Arkadiy

## 2022-01-13 IMAGING — US US MFM OB FOLLOW-UP
1 series · 13 of 28 positions shown · non-contrast
Comparison: none

[Series 1: us mfm ob follow-up · 39 acquisitions, 13 frames shown]
[im 2/39]
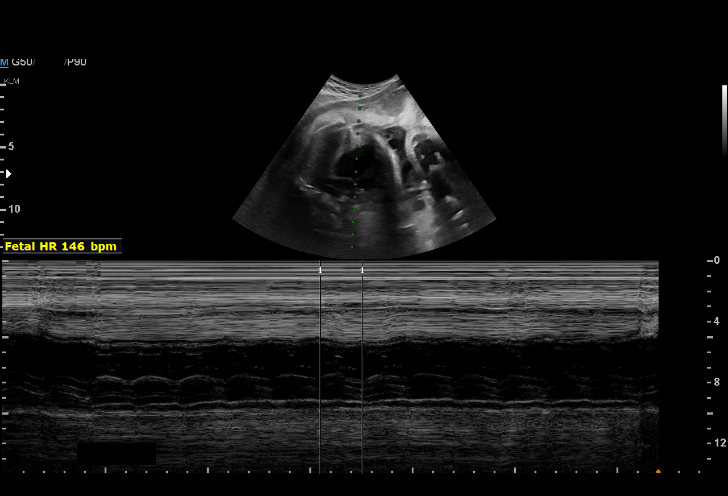
[im 5/39]
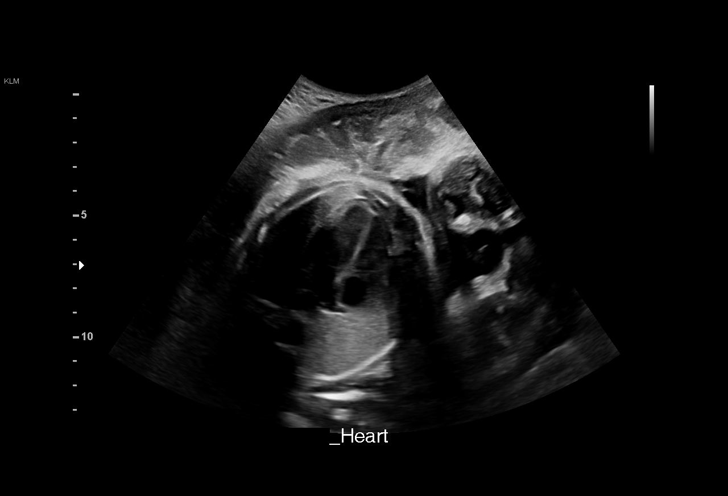
[im 8/39]
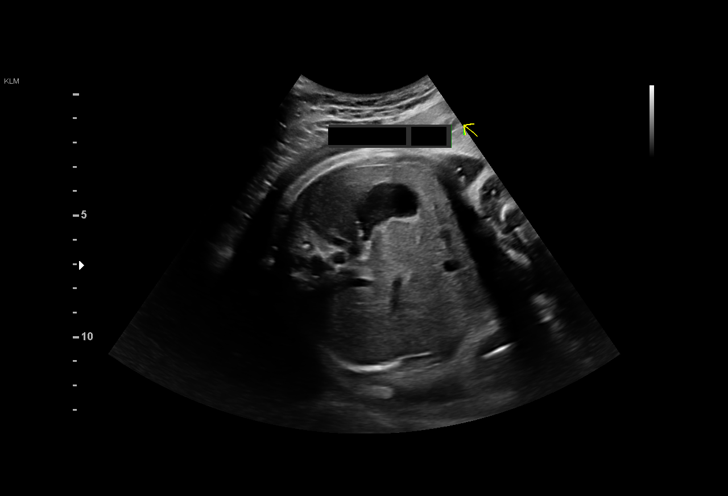
[im 10/39]
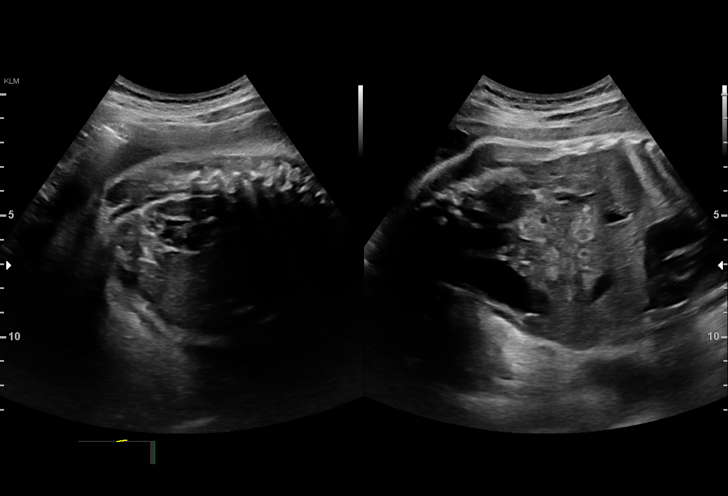
[im 13/39]
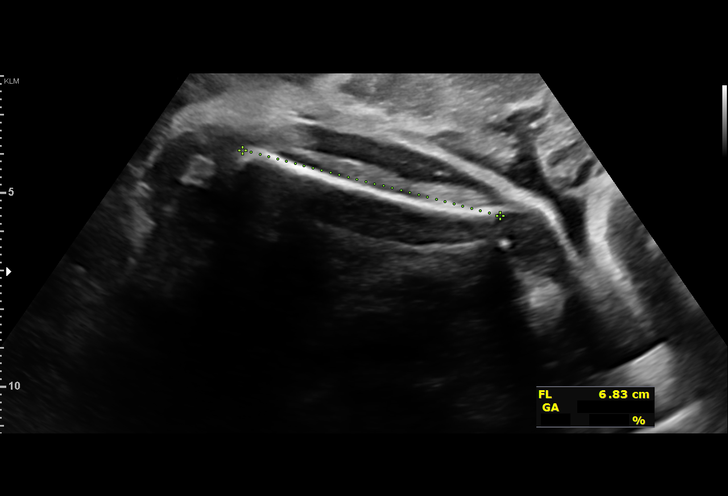
[im 16/39]
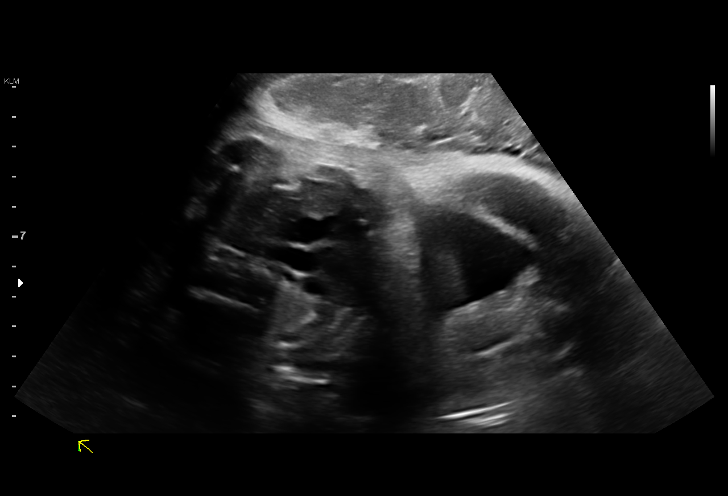
[im 20/39]
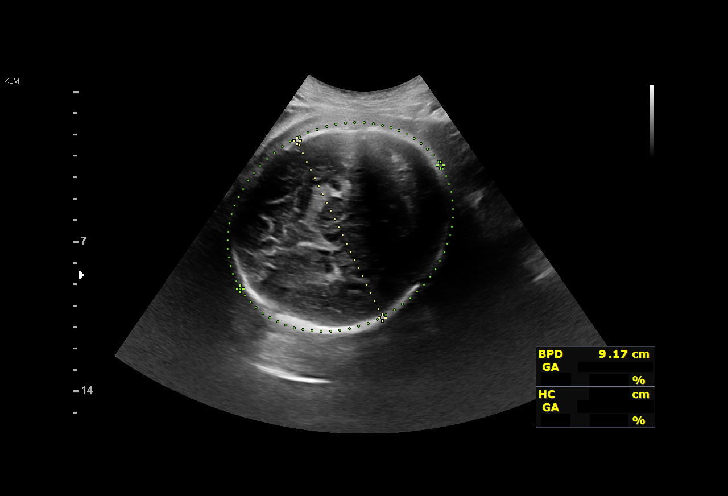
[im 23/39]
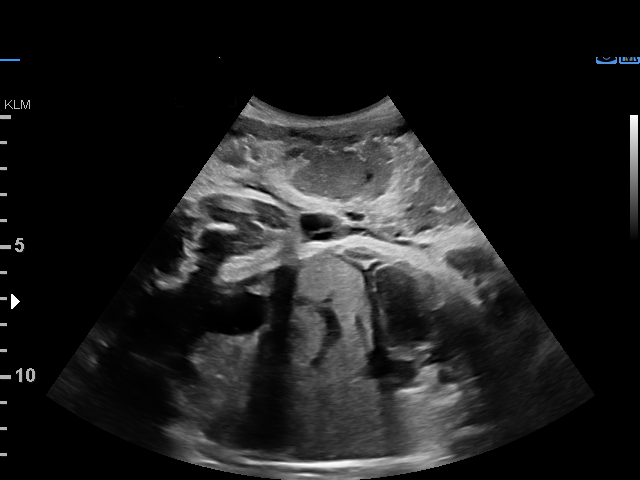
[im 26/39]
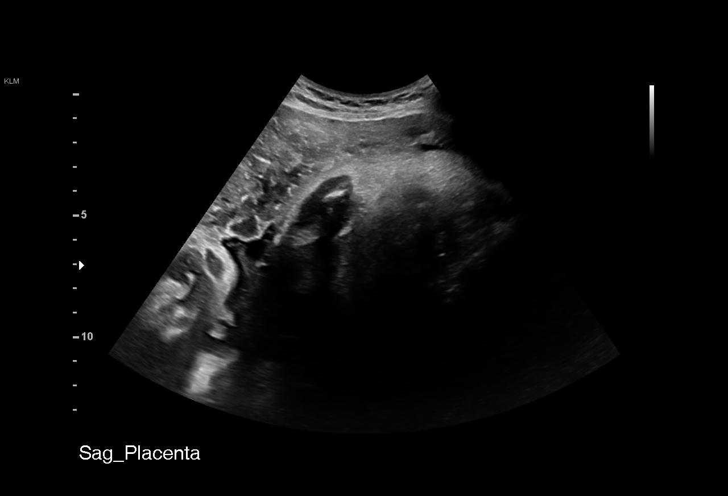
[im 29/39]
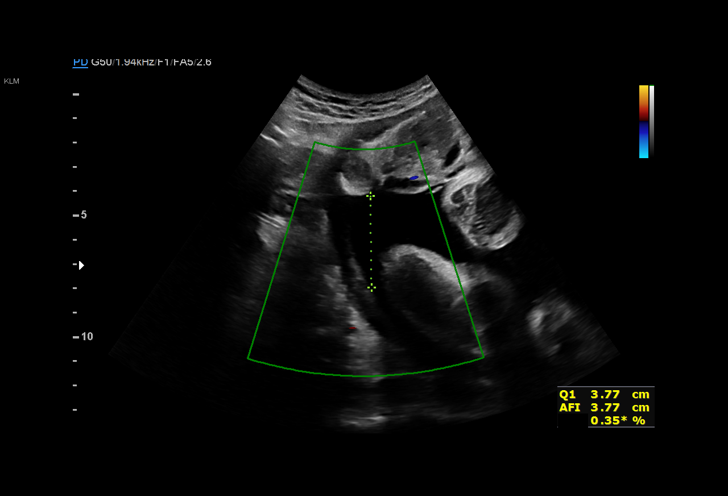
[im 31/39]
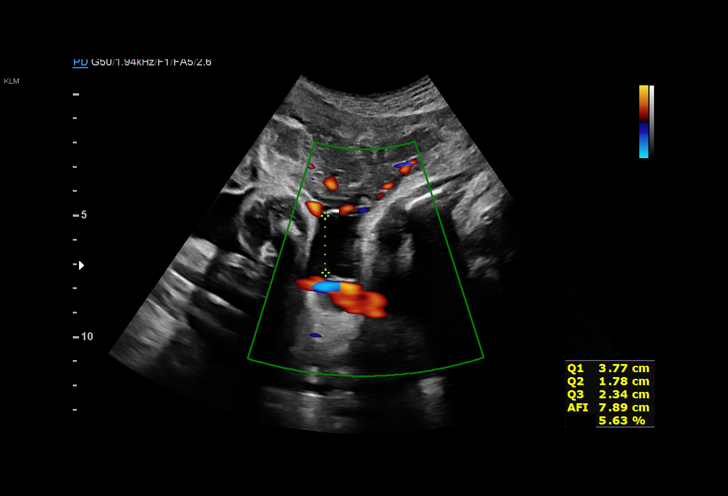
[im 34/39]
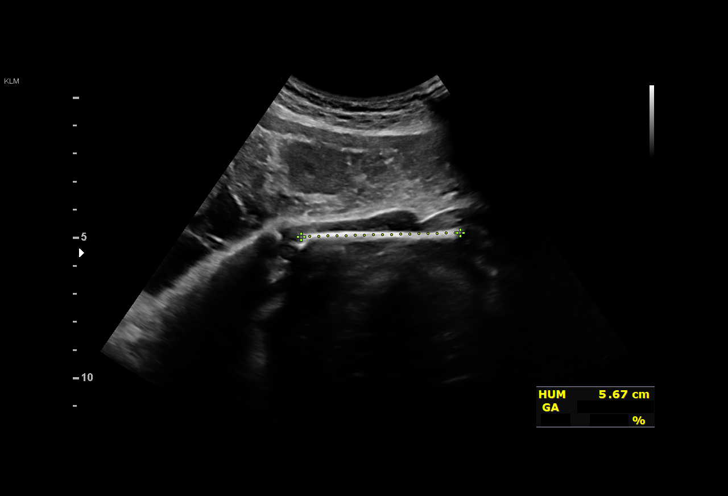
[im 37/39]
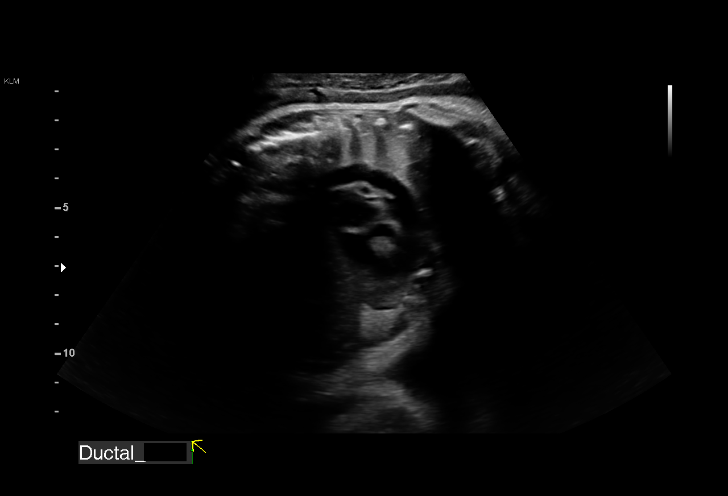

[13 of 28 positions shown; findings below may reference images not displayed]

BEYIOKU NP                              [HOSPITAL]

 ----------------------------------------------------------------------

 ----------------------------------------------------------------------
Indications

  Uterine size-date discrepancy, third trimester
  36 weeks gestation of pregnancy
  Hypertension - Gestational
 ----------------------------------------------------------------------
Fetal Evaluation

 Num Of Fetuses:         1
 Fetal Heart Rate(bpm):  146
 Cardiac Activity:       Observed
 Presentation:           Cephalic
 Placenta:               Anterior
 P. Cord Insertion:      Previously Visualized

 Amniotic Fluid
 AFI FV:      Within normal limits

 AFI Sum(cm)     %Tile       Largest Pocket(cm)
 9.83            21

 RUQ(cm)       RLQ(cm)       LUQ(cm)        LLQ(cm)


 Comment:    Good fetal breathing seen.
Biometry

 BPD:      89.3  mm     G. Age:  36w 1d         59  %    CI:        79.17   %    70 - 86
                                                         FL/HC:      21.6   %    20.1 -
 HC:      317.3  mm     G. Age:  35w 5d         13  %    HC/AC:      0.99        0.93 -
 AC:      319.3  mm     G. Age:  35w 6d         52  %    FL/BPD:     76.6   %    71 - 87
 FL:       68.4  mm     G. Age:  35w 1d         21  %    FL/AC:      21.4   %    20 - 24
 HUM:      58.2  mm     G. Age:  33w 5d         23  %

 Est. FW:    0189  gm      6 lb 1 oz     39  %
OB History

 Gravidity:    1         Term:   0        Prem:   0        SAB:   0
 TOP:          0       Ectopic:  0        Living: 0
Gestational Age

 LMP:           36w 2d        Date:  07/27/18                 EDD:   05/03/19
 U/S Today:     35w 5d                                        EDD:   05/07/19
 Best:          36w 1d     Det. By:  Early Ultrasound         EDD:   05/04/19
                                     (09/12/18)
Anatomy

 Cranium:               Appears normal         Aortic Arch:            Appears normal
 Cavum:                 Previously seen        Ductal Arch:            Appears normal
 Ventricles:            Previously seen        Diaphragm:              Appears normal
 Choroid Plexus:        Previously seen        Stomach:                Appears normal, left
                                                                       sided
 Cerebellum:            Previously seen        Abdomen:                Appears normal
 Posterior Fossa:       Previously seen        Abdominal Wall:         Previously seen
 Nuchal Fold:           Previously seen        Cord Vessels:           Appears normal (3
                                                                       vessel cord)
 Face:                  Orbits and profile     Kidneys:                Appear normal
                        previously seen
 Lips:                  Previously seen        Bladder:                Appears normal
 Thoracic:              Appears normal         Spine:                  Previously seen
 Heart:                 Appears normal         Upper Extremities:      Previously seen
                        (4CH, axis, and
                        situs)
 RVOT:                  Previously seen        Lower Extremities:      Previously seen
 LVOT:                  Appears normal

 Other:  Technically difficult due to fetal position. Nasal bone previously
         visualized. Lt hand, 5th and heel previously visualized.
Cervix Uterus Adnexa

 Cervix
 Not visualized (advanced GA >61wks)
Comments

 This patient was seen for a growth ultrasound due to uterine
 size/dates discrepancy.
 The fetal growth and amniotic fluid level appears appropriate
 for her gestational age.

## 2022-09-16 ENCOUNTER — Inpatient Hospital Stay (HOSPITAL_BASED_OUTPATIENT_CLINIC_OR_DEPARTMENT_OTHER)
Admission: EM | Admit: 2022-09-16 | Discharge: 2022-09-17 | Disposition: A | Discharge status: Home / Self Care | Payer: Medicaid Other | Attending: Obstetrics and Gynecology | Admitting: Obstetrics and Gynecology

## 2022-09-16 ENCOUNTER — Encounter (HOSPITAL_BASED_OUTPATIENT_CLINIC_OR_DEPARTMENT_OTHER): Payer: Self-pay

## 2022-09-16 ENCOUNTER — Other Ambulatory Visit: Payer: Self-pay

## 2022-09-16 DIAGNOSIS — R7989 Other specified abnormal findings of blood chemistry: Secondary | ICD-10-CM | POA: Diagnosis not present

## 2022-09-16 DIAGNOSIS — O99891 Other specified diseases and conditions complicating pregnancy: Secondary | ICD-10-CM | POA: Insufficient documentation

## 2022-09-16 DIAGNOSIS — O009 Unspecified ectopic pregnancy without intrauterine pregnancy: Secondary | ICD-10-CM | POA: Diagnosis not present

## 2022-09-16 DIAGNOSIS — N3001 Acute cystitis with hematuria: Secondary | ICD-10-CM | POA: Insufficient documentation

## 2022-09-16 DIAGNOSIS — O4690 Antepartum hemorrhage, unspecified, unspecified trimester: Secondary | ICD-10-CM | POA: Diagnosis present

## 2022-09-16 DIAGNOSIS — O0338 Urinary tract infection following incomplete spontaneous abortion: Secondary | ICD-10-CM | POA: Diagnosis not present

## 2022-09-16 DIAGNOSIS — R091 Pleurisy: Secondary | ICD-10-CM | POA: Diagnosis present

## 2022-09-16 DIAGNOSIS — Z3A00 Weeks of gestation of pregnancy not specified: Secondary | ICD-10-CM | POA: Insufficient documentation

## 2022-09-16 LAB — URINALYSIS, ROUTINE W REFLEX MICROSCOPIC
Bilirubin Urine: NEGATIVE
Glucose, UA: NEGATIVE mg/dL
Ketones, ur: NEGATIVE mg/dL
Nitrite: NEGATIVE
Protein, ur: 30 mg/dL — AB
RBC / HPF: >50 RBC/hpf (ref 0–5)
Specific Gravity, Urine: 1.032 — ABNORMAL HIGH (ref 1.005–1.030)
WBC, UA: >50 WBC/hpf (ref 0–5)
pH: 6 (ref 5.0–8.0)

## 2022-09-16 LAB — CBC
HCT: 29.1 % — ABNORMAL LOW (ref 36.0–46.0)
Hemoglobin: 9.6 g/dL — ABNORMAL LOW (ref 12.0–15.0)
MCH: 28.5 pg (ref 26.0–34.0)
MCHC: 33 g/dL (ref 30.0–36.0)
MCV: 86.4 fL (ref 80.0–100.0)
Platelets: 352 K/uL (ref 150–400)
RBC: 3.37 MIL/uL — ABNORMAL LOW (ref 3.87–5.11)
RDW: 11.9 % (ref 11.5–15.5)
WBC: 8.9 K/uL (ref 4.0–10.5)
nRBC: 0 % (ref 0.0–0.2)

## 2022-09-16 LAB — COMPREHENSIVE METABOLIC PANEL
ALT: 22 U/L (ref 0–44)
AST: 27 U/L (ref 15–41)
Albumin: 4.4 g/dL (ref 3.5–5.0)
Alkaline Phosphatase: 70 U/L (ref 38–126)
Anion gap: 10 (ref 5–15)
BUN: 11 mg/dL (ref 6–20)
CO2: 25 mmol/L (ref 22–32)
Calcium: 9.5 mg/dL (ref 8.9–10.3)
Chloride: 104 mmol/L (ref 98–111)
Creatinine, Ser: 0.63 mg/dL (ref 0.44–1.00)
GFR, Estimated: >60 mL/min (ref >60)
Glucose, Bld: 87 mg/dL (ref 70–99)
Potassium: 3.5 mmol/L (ref 3.5–5.1)
Sodium: 139 mmol/L (ref 135–145)
Total Bilirubin: 0.3 mg/dL (ref 0.3–1.2)
Total Protein: 8 g/dL (ref 6.5–8.1)

## 2022-09-16 LAB — PREGNANCY, URINE: Preg Test, Ur: POSITIVE — AB

## 2022-09-16 LAB — HCG, QUANTITATIVE, PREGNANCY: hCG, Beta Chain, Quant, S: 20 m[IU]/mL — ABNORMAL HIGH (ref <5)

## 2022-09-16 NOTE — ED Triage Notes (Addendum)
POV from home, A&O x 4, GCS 15, amb to triage  Sts lower abd pain, right side flank pain, miscarriage last month at approx [redacted] weeks pregnant, had heavy and painful period and still bleeding x 2 weeks.

## 2022-09-17 ENCOUNTER — Emergency Department (HOSPITAL_BASED_OUTPATIENT_CLINIC_OR_DEPARTMENT_OTHER): Payer: Medicaid Other

## 2022-09-17 ENCOUNTER — Inpatient Hospital Stay (HOSPITAL_COMMUNITY): Payer: Medicaid Other

## 2022-09-17 DIAGNOSIS — R7989 Other specified abnormal findings of blood chemistry: Secondary | ICD-10-CM | POA: Diagnosis not present

## 2022-09-17 DIAGNOSIS — R091 Pleurisy: Secondary | ICD-10-CM | POA: Diagnosis present

## 2022-09-17 DIAGNOSIS — Z3A00 Weeks of gestation of pregnancy not specified: Secondary | ICD-10-CM | POA: Diagnosis not present

## 2022-09-17 DIAGNOSIS — O4690 Antepartum hemorrhage, unspecified, unspecified trimester: Secondary | ICD-10-CM | POA: Diagnosis present

## 2022-09-17 DIAGNOSIS — O009 Unspecified ectopic pregnancy without intrauterine pregnancy: Secondary | ICD-10-CM | POA: Diagnosis not present

## 2022-09-17 DIAGNOSIS — O0338 Urinary tract infection following incomplete spontaneous abortion: Secondary | ICD-10-CM | POA: Diagnosis not present

## 2022-09-17 DIAGNOSIS — O99891 Other specified diseases and conditions complicating pregnancy: Secondary | ICD-10-CM | POA: Diagnosis not present

## 2022-09-17 DIAGNOSIS — N3001 Acute cystitis with hematuria: Secondary | ICD-10-CM | POA: Diagnosis present

## 2022-09-17 LAB — CBC
HCT: 30.5 % — ABNORMAL LOW (ref 36.0–46.0)
Hemoglobin: 10.1 g/dL — ABNORMAL LOW (ref 12.0–15.0)
MCH: 28.9 pg (ref 26.0–34.0)
MCHC: 33.1 g/dL (ref 30.0–36.0)
MCV: 87.4 fL (ref 80.0–100.0)
Platelets: 339 10*3/uL (ref 150–400)
RBC: 3.49 MIL/uL — ABNORMAL LOW (ref 3.87–5.11)
RDW: 11.9 % (ref 11.5–15.5)
WBC: 8.7 10*3/uL (ref 4.0–10.5)
nRBC: 0 % (ref 0.0–0.2)

## 2022-09-17 LAB — D-DIMER, QUANTITATIVE: D-Dimer, Quant: 6.91 ug/mL-FEU — ABNORMAL HIGH (ref 0.00–0.50)

## 2022-09-17 LAB — ABO/RH: ABO/RH(D): B POS

## 2022-09-17 MED ORDER — IOHEXOL 350 MG/ML SOLN
100.0000 mL | Freq: Once | INTRAVENOUS | Status: AC | PRN
  Administered 2022-09-17: 75 mL via INTRAVENOUS

## 2022-09-17 MED ORDER — SODIUM CHLORIDE 0.9 % IV BOLUS
1000.0000 mL | Freq: Once | INTRAVENOUS | Status: AC
  Administered 2022-09-17: 1000 mL via INTRAVENOUS

## 2022-09-17 MED ORDER — FENTANYL CITRATE PF 50 MCG/ML IJ SOSY
50.0000 ug | PREFILLED_SYRINGE | Freq: Once | INTRAMUSCULAR | Status: AC
  Administered 2022-09-17: 50 ug via INTRAVENOUS
  Filled 2022-09-17: qty 1

## 2022-09-17 MED ORDER — OXYCODONE HCL 5 MG PO TABS: 5.0000 mg | ORAL_TABLET | Freq: Three times a day (TID) | ORAL | 0 refills | Status: DC | PRN

## 2022-09-17 MED ORDER — ONDANSETRON HCL 4 MG/2ML IJ SOLN
4.0000 mg | Freq: Once | INTRAMUSCULAR | Status: AC
  Administered 2022-09-17: 4 mg via INTRAVENOUS
  Filled 2022-09-17: qty 2

## 2022-09-17 MED ORDER — SODIUM CHLORIDE 0.9 % IV SOLN
1.0000 g | Freq: Once | INTRAVENOUS | Status: AC
  Administered 2022-09-17: 1 g via INTRAVENOUS
  Filled 2022-09-17: qty 10

## 2022-09-17 NOTE — ED Provider Notes (Signed)
[place] EMERGENCY DEPARTMENT AT [facility] Provider Note   CSN: [id number] Arrival date & time: 09/16/22  2215     History  Chief Complaint  Patient presents with   Abdominal Pain    Lashawne Rowlee is a 22 y.o. female.  The history is provided by the patient and a relative.   Patient presents with 2 complaints  Patient reports for the past week she has had right upper quadrant pain and pain into her right chest and shoulder.  It seems worse with breathing.  She reports mild shortness of breath.  No fevers or cough.  No previous history of VTE.  No recent travel or surgery No leg swelling is reported  Patient also reports that she had a miscarriage approximately 1 month ago.  She reports she was around [redacted] weeks pregnant at that time.  She did not seek any care and has not received any medications or surgeries for the miscarriage.  She did have an ultrasound at an outside facility that confirmed a pregnancy of around 4 weeks at that time.  She has not been seen by OB/GYN since that time. Patient reports since the miscarriage, she has had intermittent bleeding, with this most recent episode starting around June 23.  She continues to have bleeding and also large clots. She has been pregnant 1 other time.    Home Medications Prior to Admission medications   Medication Sig Start Date End Date Taking? Authorizing Provider  albuterol (VENTOLIN HFA) 108 (90 Base) MCG/ACT inhaler Inhale 2 puffs into the lungs every 4 (four) hours as needed for wheezing or shortness of breath. 09/17/17   [provider]  Blood Pressure KIT 1 Device by Does not apply route once a week. To be monitored weekly at home Patient not taking: Reported on 06/18/2019 10/17/18   Reva Bores, MD  hydrOXYzine (ATARAX/VISTARIL) 25 MG tablet Take 1 tablet (25 mg total) by mouth at bedtime as needed for itching. Patient not taking: Reported on 06/18/2019 05/21/19   Federico Flake, MD  Prenatal Vit-Fe  Fumarate-FA (MULTIVITAMIN-PRENATAL) 27-0.8 MG TABS tablet Take 1 tablet by mouth daily at 12 noon.    [provider]  sertraline (ZOLOFT) 50 MG tablet Take 1 tablet (50 mg total) by mouth daily. Increase to 2 pills daily (50mg ) in 1 week if no serious side effects. 06/18/19   Federico Flake, MD      Allergies    Patient has no known allergies.    Review of Systems   Review of Systems  Constitutional:  Negative for fever.  Respiratory:  Negative for cough.   Cardiovascular:  Positive for chest pain. Negative for leg swelling.  Genitourinary:  Positive for vaginal bleeding. Negative for dysuria.    Physical Exam Updated Vital Signs BP 134/80   Pulse (!) 107   Temp 97.6 F (36.4 C)   Resp 18   Ht 1.575 m (5\' 2" )   Wt 63.5 kg   LMP 09/02/2022 (Approximate)   SpO2 99%   BMI 25.61 kg/m  Physical Exam CONSTITUTIONAL: Well developed/well nourished HEAD: Normocephalic/atraumatic EYES: EOMI/PERRL ENMT: Mucous membranes moist NECK: supple no meningeal signs CV: S1/S2 noted, no murmurs/rubs/gallops noted LUNGS: Lungs are clear to auscultation bilaterally, no apparent distress ABDOMEN: soft, mild right upper quadrant tenderness, no rebound or guarding, bowel sounds noted throughout abdomen Diffuse mild tenderness to the lower abdomen GU:no cva tenderness Pelvic exam chaperoned by female nurse tech No CMT.  Small amount of blood is  noted.  No active bleeding, os appears closed No products of conception noted NEURO: Pt is awake/alert/appropriate, moves all extremitiesx4.  No facial droop.   EXTREMITIES: pulses normal/equal, full ROM SKIN: warm, color normal PSYCH: Anxious  ED Results / Procedures / Treatments   Labs (all labs ordered are listed, but only abnormal results are displayed) Labs Reviewed  PREGNANCY, URINE - Abnormal; Notable for the following components:      Result Value   Preg Test, Ur POSITIVE (*)    All other components within normal limits  CBC  - Abnormal; Notable for the following components:   RBC 3.37 (*)    Hemoglobin 9.6 (*)    HCT 29.1 (*)    All other components within normal limits  HCG, QUANTITATIVE, PREGNANCY - Abnormal; Notable for the following components:   hCG, Beta Chain, Quant, S 20 (*)    All other components within normal limits  URINALYSIS, ROUTINE W REFLEX MICROSCOPIC - Abnormal; Notable for the following components:   APPearance HAZY (*)    Specific Gravity, Urine 1.032 (*)    Hgb urine dipstick LARGE (*)    Protein, ur 30 (*)    Leukocytes,Ua MODERATE (*)    Bacteria, UA RARE (*)    All other components within normal limits  D-DIMER, QUANTITATIVE - Abnormal; Notable for the following components:   D-Dimer, Quant 6.91 (*)    All other components within normal limits  COMPREHENSIVE METABOLIC PANEL  ABO/RH    EKG EKG Interpretation Date/Time:  Sunday September 17 2022 00:19:31 EDT Ventricular Rate:  91 PR Interval:  167 QRS Duration:  84 QT Interval:  349 QTC Calculation: 430 R Axis:   84  Text Interpretation: Sinus rhythm Borderline T wave abnormalities No previous ECGs available Confirmed by Zadie Rhine ([id number]) on 09/17/2022 12:21:04 AM  Radiology CT Angio Chest PE W and/or Wo Contrast  Result Date: 09/17/2022 CLINICAL DATA:  Pulmonary embolism (PE) suspected, high prob. Abdominal pain, flank pain EXAM: CT ANGIOGRAPHY CHEST WITH CONTRAST TECHNIQUE: Multidetector CT imaging of the chest was performed using the standard protocol during bolus administration of intravenous contrast. Multiplanar CT image reconstructions and MIPs were obtained to evaluate the vascular anatomy. RADIATION DOSE REDUCTION: This exam was performed according to the departmental dose-optimization program which includes automated exposure control, adjustment of the mA and/or kV according to patient size and/or use of iterative reconstruction technique. CONTRAST:  75mL OMNIPAQUE IOHEXOL 350 MG/ML SOLN COMPARISON:  None Available.  FINDINGS: Cardiovascular: No filling defects in the pulmonary arteries to suggest pulmonary emboli. Heart is normal size. Aorta is normal caliber. Mediastinum/Nodes: No mediastinal, hilar, or axillary adenopathy. Trachea and esophagus are unremarkable. Thyroid unremarkable. Lungs/Pleura: Lungs are clear. No focal airspace opacities or suspicious nodules. No effusions. Upper Abdomen: No acute findings Musculoskeletal: Chest wall soft tissues are unremarkable. No acute bony abnormality. Review of the MIP images confirms the above findings. IMPRESSION: No evidence of pulmonary embolus No acute cardiopulmonary disease. Electronically Signed   By: Charlett Nose M.D.   On: 09/17/2022 01:46   DG Chest Portable 1 View  Result Date: 09/17/2022 CLINICAL DATA:  Lower abdominal pain and right-sided flank pain with recent miscarriage 4 weeks ago and persistent vaginal bleeding. EXAM: PORTABLE CHEST 1 VIEW COMPARISON:  March 29, 2017 FINDINGS: The heart size and mediastinal contours are within normal limits. Both lungs are clear. The visualized skeletal structures are unremarkable. IMPRESSION: No active disease. Electronically Signed   By: Aram Candela M.D.   On: 09/17/2022  00:37    Procedures Procedures    Medications Ordered in ED Medications  cefTRIAXone (ROCEPHIN) 1 g in sodium chloride 0.9 % 100 mL IVPB (0 g Intravenous Stopped 09/17/22 0128)  fentaNYL (SUBLIMAZE) injection 50 mcg (50 mcg Intravenous Given 09/17/22 0035)  ondansetron (ZOFRAN) injection 4 mg (4 mg Intravenous Given 09/17/22 0034)  sodium chloride 0.9 % bolus 1,000 mL (0 mLs Intravenous Stopped 09/17/22 0153)  iohexol (OMNIPAQUE) 350 MG/ML injection 100 mL (75 mLs Intravenous Contrast Given 09/17/22 0137)    ED Course/ Medical Decision Making/ A&P Clinical Course as of 09/17/22 0202  Sun Sep 17, 2022  0026 Patient presents with 2 issues.  For the pleuritic pain, will obtain EKG, chest x-ray and D-dimer  Patient is also had intermittent  vaginal bleeding for over a month with a presumed miscarriage.  Strong suspicion patient has retained products of conception.  Patient will require pelvic ultrasound [DW]  0107 D-dimer elevated.  Patient tachycardic.  Will proceed with CT imaging to rule out PE [DW]  0201 CT chest is negative for PE. [DW]  0201 At this point, patient is stable for transfer to the [facility].  She will need to undergo pelvic ultrasound.  Discussed with Dr. Berton Lan on-call OB/GYN and she will be accepted to the [facility] for further workup Patient may also benefit from right upper quadrant ultrasound due to pain in that region as well. [DW]    Clinical Course User Index [DW] Zadie Rhine, MD                             Medical Decision Making Amount and/or Complexity of Data Reviewed Labs: ordered. Radiology: ordered. ECG/medicine tests: ordered.  Risk Prescription drug management.   This patient presents to the ED for concern of abdominal pain, this involves an extensive number of treatment options, and is a complaint that carries with it a high risk of complications and morbidity.  The differential diagnosis includes but is not limited to cholecystitis, cholelithiasis, pancreatitis, gastritis, peptic ulcer disease, appendicitis, bowel obstruction, bowel perforation, diverticulitis, ectopic pregnancy, PID, ovarian torsion, TOA, retained products of conception    Comorbidities that complicate the patient evaluation: Patient's presentation is complicated by their history of preeclampsia  Social Determinants of Health: Patient's impaired access to primary care  increases the complexity of managing their presentation  Additional history obtained: Additional history obtained from family Records reviewed previous admission documents  Lab Tests: I Ordered, and personally interpreted labs.  The pertinent results include: Positive pregnancy test, UTI  Imaging Studies ordered: I ordered imaging studies  including X-ray chest   I independently visualized and interpreted imaging which showed no acute findings I agree with the radiologist interpretation  Cardiac Monitoring: The patient was maintained on a cardiac monitor.  I personally viewed and interpreted the cardiac monitor which showed an underlying rhythm of:  sinus rhythm  Medicines ordered and prescription drug management: I ordered medication including fentanyl for pain Reevaluation of the patient after these medicines showed that the patient    improved  Critical Interventions:   transfer for definitive management by OB/GYN  Consultations Obtained: I requested consultation with the consultant OB/GYN , and discussed  findings as well as pertinent plan - they recommend: Accept for transfer for ultrasound  Reevaluation: After the interventions noted above, I reevaluated the patient and found that they have :improved  Complexity of problems addressed: Patient's presentation is most consistent with  acute presentation  with potential threat to life or bodily function          Final Clinical Impression(s) / ED Diagnoses Final diagnoses:  Vaginal bleeding in pregnancy  Pleurisy  Acute cystitis with hematuria    Rx / DC Orders ED Discharge Orders     None         Zadie Rhine, MD 09/17/22 0203

## 2022-09-17 NOTE — MAU Provider Note (Signed)
Chief Complaint: Abdominal Pain   Event Date/Time   First Provider Initiated Contact with Patient 09/17/22 0515      SUBJECTIVE HPI: Julie Spears is a 22 y.o. G1P1001 at Unknown by unsure LMP  who presents to maternity admissions reporting sent from [facility] ED for right shoulder, right chest, and right lower abdomen pain with recent miscarriage. She reports LMP in March, a positive pregnancy test in April, a confirmation ultrasound with IUP at a nonmedical facility, then onset of heavy bleeding with clots and tissue the first week of June. She did not seek care and did not have any follow up for the miscarriage. She did not take any more pregnancy tests.  She had a normal period on June 10, lasting 5 days, then bleeding again at the end of June that lasting longer than her usual menses. She last had intercourse 2 weeks ago.      HPI  Past Medical History:  Diagnosis Date   Asthma    Pregnancy induced hypertension    Past Surgical History:  Procedure Laterality Date   MOUTH SURGERY     Social History   Socioeconomic History   Marital status: Single    Spouse name: Not on file   Number of children: Not on file   Years of education: Not on file   Highest education level: Not on file  Occupational History   Not on file  Tobacco Use   Smoking status: Never   Smokeless tobacco: Never  Vaping Use   Vaping Use: Never used  Substance and Sexual Activity   Alcohol use: Never   Drug use: Never   Sexual activity: Not on file  Other Topics Concern   Not on file  Social History Narrative   Lives at home with mother, 14yo sister, 12yo sister, 10yo brother, and 52mo brother. No smokers in the home.   Social Determinants of Health   Financial Resource Strain: Not on file  Food Insecurity: Not on file  Transportation Needs: Not on file  Physical Activity: Not on file  Stress: Not on file  Social Connections: Not on file  Intimate Partner Violence: Not on file   No current  facility-administered medications on file prior to encounter.   Current Outpatient Medications on File Prior to Encounter  Medication Sig Dispense Refill   albuterol (VENTOLIN HFA) 108 (90 Base) MCG/ACT inhaler Inhale 2 puffs into the lungs every 4 (four) hours as needed for wheezing or shortness of breath.     Blood Pressure KIT 1 Device by Does not apply route once a week. To be monitored weekly at home (Patient not taking: Reported on 06/18/2019) 1 kit 0   hydrOXYzine (ATARAX/VISTARIL) 25 MG tablet Take 1 tablet (25 mg total) by mouth at bedtime as needed for itching. (Patient not taking: Reported on 06/18/2019) 30 tablet 2   Prenatal Vit-Fe Fumarate-FA (MULTIVITAMIN-PRENATAL) 27-0.8 MG TABS tablet Take 1 tablet by mouth daily at 12 noon.     sertraline (ZOLOFT) 50 MG tablet Take 1 tablet (50 mg total) by mouth daily. Increase to 2 pills daily (50mg ) in 1 week if no serious side effects. 30 tablet 11   No Known Allergies  ROS:  Review of Systems  Constitutional:  Negative for chills, fatigue and fever.  Respiratory:  Positive for chest tightness. Negative for shortness of breath.   Cardiovascular:  Negative for chest pain.  Gastrointestinal:  Positive for abdominal pain.  Genitourinary:  Positive for vaginal bleeding. Negative for difficulty urinating,  dysuria, flank pain, pelvic pain, vaginal discharge and vaginal pain.  Musculoskeletal:  Positive for myalgias.       Right shoulder pain  Neurological:  Negative for dizziness and headaches.  Psychiatric/Behavioral: Negative.       I have reviewed patient's Past Medical Hx, Surgical Hx, Family Hx, Social Hx, medications and allergies.   Physical Exam  Patient Vitals for the past 24 hrs:  BP Temp Temp src Pulse Resp SpO2 Height Weight  09/17/22 0332 125/82 98.5 F (36.9 C) Oral 96 18 100 % -- --  09/17/22 0230 118/79 -- -- (!) 102 15 98 % -- --  09/17/22 0200 (!) 127/90 -- -- -- 18 -- -- --  09/17/22 0130 (!) 139/102 -- -- 89 17 100 %  -- --  09/17/22 0102 134/80 -- -- (!) 107 18 99 % -- --  09/17/22 0000 131/86 -- -- 99 18 100 % -- --  09/16/22 2335 (!) 130/92 -- -- 95 -- 100 % -- --  09/16/22 2258 (!) 158/102 97.6 F (36.4 C) -- 95 18 100 % -- --  09/16/22 2257 -- -- -- -- -- -- 5\' 2"  (1.575 m) 63.5 kg   Constitutional: Well-developed, well-nourished female in no acute distress.  Cardiovascular: normal rate Respiratory: normal effort GI: Abd soft, non-tender. Pos BS x 4 MS: Extremities nontender, no edema, normal ROM Neurologic: Alert and oriented x 4.  GU: Neg CVAT.  PELVIC EXAM: Deferred. Dr Berton Lan to perform exam.   LAB RESULTS Results for orders placed or performed during the hospital encounter of 09/16/22 (from the past 24 hour(s))  hCG, quantitative, pregnancy     Status: Abnormal   Collection Time: 09/16/22 11:02 PM  Result Value Ref Range   hCG, Beta Chain, Quant, S 20 (H) <5 mIU/mL  Pregnancy, urine     Status: Abnormal   Collection Time: 09/16/22 11:05 PM  Result Value Ref Range   Preg Test, Ur POSITIVE (A) NEGATIVE  CBC     Status: Abnormal   Collection Time: 09/16/22 11:05 PM  Result Value Ref Range   WBC 8.9 4.0 - 10.5 K/uL   RBC 3.37 (L) 3.87 - 5.11 MIL/uL   Hemoglobin 9.6 (L) 12.0 - 15.0 g/dL   HCT 16.1 (L) 09.6 - 04.5 %   MCV 86.4 80.0 - 100.0 fL   MCH 28.5 26.0 - 34.0 pg   MCHC 33.0 30.0 - 36.0 g/dL   RDW 40.9 81.1 - 91.4 %   Platelets 352 150 - 400 K/uL   nRBC 0.0 0.0 - 0.2 %  Comprehensive metabolic panel     Status: None   Collection Time: 09/16/22 11:05 PM  Result Value Ref Range   Sodium 139 135 - 145 mmol/L   Potassium 3.5 3.5 - 5.1 mmol/L   Chloride 104 98 - 111 mmol/L   CO2 25 22 - 32 mmol/L   Glucose, Bld 87 70 - 99 mg/dL   BUN 11 6 - 20 mg/dL   Creatinine, Ser 7.82 0.44 - 1.00 mg/dL   Calcium 9.5 8.9 - 95.6 mg/dL   Total Protein 8.0 6.5 - 8.1 g/dL   Albumin 4.4 3.5 - 5.0 g/dL   AST 27 15 - 41 U/L   ALT 22 0 - 44 U/L   Alkaline Phosphatase 70 38 - 126 U/L   Total  Bilirubin 0.3 0.3 - 1.2 mg/dL   GFR, Estimated >21 >30 mL/min   Anion gap 10 5 - 15  ABO/Rh  Status: None   Collection Time: 09/16/22 11:06 PM  Result Value Ref Range   ABO/RH(D)      B POS Performed at [facility], [address], [place], [place] [address]   Urinalysis, Routine w reflex microscopic -Urine, Clean Catch     Status: Abnormal   Collection Time: 09/16/22 11:06 PM  Result Value Ref Range   Color, Urine YELLOW YELLOW   APPearance HAZY (A) CLEAR   Specific Gravity, Urine 1.032 (H) 1.005 - 1.030   pH 6.0 5.0 - 8.0   Glucose, UA NEGATIVE NEGATIVE mg/dL   Hgb urine dipstick LARGE (A) NEGATIVE   Bilirubin Urine NEGATIVE NEGATIVE   Ketones, ur NEGATIVE NEGATIVE mg/dL   Protein, ur 30 (A) NEGATIVE mg/dL   Nitrite NEGATIVE NEGATIVE   Leukocytes,Ua MODERATE (A) NEGATIVE   RBC / HPF >50 0 - 5 RBC/hpf   WBC, UA >50 0 - 5 WBC/hpf   Bacteria, UA RARE (A) NONE SEEN   Squamous Epithelial / HPF 11-20 0 - 5 /HPF   Mucus PRESENT   D-dimer, quantitative     Status: Abnormal   Collection Time: 09/17/22 12:26 AM  Result Value Ref Range   D-Dimer, Quant 6.91 (H) 0.00 - 0.50 ug/mL-FEU    --/--/B POS Performed at [facility], [address] [place], [place] [address]  [phone])  IMAGING US OB LESS THAN 14 WEEKS WITH OB TRANSVAGINAL  Result Date: 09/17/2022 CLINICAL DATA:  Vaginal bleeding and first-trimester pregnancy. Right-sided abdominal pain. Quantitative beta HCG 20. Recent spontaneous abortion, rule out products of conception versus new pregnancy. EXAM: OBSTETRIC <14 WK [place] AND TRANSVAGINAL OB US TECHNIQUE: Both transabdominal and transvaginal ultrasound examinations were performed for complete evaluation of the gestation as well as the maternal uterus, adnexal regions, and pelvic cul-de-sac. Transvaginal technique was performed to assess early pregnancy. COMPARISON:  None Available. FINDINGS: No intrauterine gestational sac. Thin  endometrium without focal nodularity. Given the very low beta, cannot exclude early intrauterine pregnancy. There is heterogeneous thickening at the right adnexa. Intra-ovarian appearing avascular cyst with internal lace-like appearance suggesting hemorrhagic corpus luteum/cyst, up to 3.8 cm. There is adjacent somewhat serpentine cystic structure with internal mid level echoes and suspected endosalpingeal folds at the right adnexa, favor thickened tube with debris. Hemorrhage, infection, and tubal pregnancy are considerations in this setting. Moderate pelvic fluid including the same mid level echoes attributed to clot/debris. The left adnexa is unremarkable. Case discussed with Dr. Berton Lan. IMPRESSION: Pregnancy of unknown location with abnormal ultrasound including heterogeneous enlargement of the right adnexa and peritoneal debris. A complex cyst appears intra-ovarian and favors hemorrhagic cyst (3.8 cm). There is suspected right tube thickening with internal debris. Ectopic pregnancy, PID, and ruptured follicle are differential considerations. Recent failed pregnancy with no endometrial thickening for retained products of conception. Electronically Signed   By: Tiburcio Pea M.D.   On: 09/17/2022 05:52   CT Angio Chest PE W and/or Wo Contrast  Result Date: 09/17/2022 CLINICAL DATA:  Pulmonary embolism (PE) suspected, high prob. Abdominal pain, flank pain EXAM: CT ANGIOGRAPHY CHEST WITH CONTRAST TECHNIQUE: Multidetector CT imaging of the chest was performed using the standard protocol during bolus administration of intravenous contrast. Multiplanar CT image reconstructions and MIPs were obtained to evaluate the vascular anatomy. RADIATION DOSE REDUCTION: This exam was performed according to the departmental dose-optimization program which includes automated exposure control, adjustment of the mA and/or kV according to patient size and/or use of iterative reconstruction technique. CONTRAST:  75mL OMNIPAQUE  IOHEXOL 350 MG/ML SOLN COMPARISON:  None Available. FINDINGS: Cardiovascular: No filling defects in the pulmonary arteries to suggest pulmonary emboli. Heart is normal size. Aorta is normal caliber. Mediastinum/Nodes: No mediastinal, hilar, or axillary adenopathy. Trachea and esophagus are unremarkable. Thyroid unremarkable. Lungs/Pleura: Lungs are clear. No focal airspace opacities or suspicious nodules. No effusions. Upper Abdomen: No acute findings Musculoskeletal: Chest wall soft tissues are unremarkable. No acute bony abnormality. Review of the MIP images confirms the above findings. IMPRESSION: No evidence of pulmonary embolus No acute cardiopulmonary disease. Electronically Signed   By: Charlett Nose M.D.   On: 09/17/2022 01:46   DG Chest Portable 1 View  Result Date: 09/17/2022 CLINICAL DATA:  Lower abdominal pain and right-sided flank pain with recent miscarriage 4 weeks ago and persistent vaginal bleeding. EXAM: PORTABLE CHEST 1 VIEW COMPARISON:  March 29, 2017 FINDINGS: The heart size and mediastinal contours are within normal limits. Both lungs are clear. The visualized skeletal structures are unremarkable. IMPRESSION: No active disease. Electronically Signed   By: Aram Candela M.D.   On: 09/17/2022 00:37    MAU Management/MDM: Orders Placed This Encounter  Procedures   DG Chest Portable 1 View   CT Angio Chest PE W and/or Wo Contrast   US OB LESS THAN 14 WEEKS WITH OB TRANSVAGINAL   Pregnancy, urine   CBC   Comprehensive metabolic panel   hCG, quantitative, pregnancy   Urinalysis, Routine w reflex microscopic -Urine, Clean Catch   D-dimer, quantitative   CBC   Diet NPO time specified   Pelvic cart   EKG 12-Lead   EKG 12-Lead   ABO/Rh    Meds ordered this encounter  Medications   cefTRIAXone (ROCEPHIN) 1 g in sodium chloride 0.9 % 100 mL IVPB    Order Specific Question:   Antibiotic Indication:    Answer:   UTI   fentaNYL (SUBLIMAZE) injection 50 mcg   ondansetron  (ZOFRAN) injection 4 mg   sodium chloride 0.9 % bolus 1,000 mL   iohexol (OMNIPAQUE) 350 MG/ML injection 100 mL    No acute abdomen.  Pt VS stable.  Radiologist called to report abnormal US findings with differential dx including hemorrhagic cyst, ectopic pregnancy, or PID.  Consult Dr Berton Lan, who will evaluate patient in MAU.    ASSESSMENT 1. Vaginal bleeding in pregnancy   2. Pleurisy   3. Acute cystitis with hematuria     PLAN See separate note by Dr Linnell Fulling Certified Nurse-Midwife 09/17/2022  6:24 AM

## 2022-09-17 NOTE — Discharge Instructions (Signed)
Please return on 7/9 anytime to get bHCG lab draw. If you have any worse vaginal bleeding, pelvic pain, vomitting, please return to MAU immediately.

## 2022-09-17 NOTE — MAU Provider Note (Signed)
MAU Provider Note  History  [id number]  Arrival date and time: 09/16/22 2215   Chief Complaint  Patient presents with   Abdominal Pain    HPI Julie Spears is a 22 y.o. G1P1001 by LMP with PMHx notable for recent miscarriage, who presents for pelvic pain. She has elevated bHCG at 20. US showed R adnexal enlargement and peritoneal debris and complex cyst, and R fallopian tube thickening and internal debris. No IUP. Concern for PID vs ectopic vs ruptured follicle. Pt afebrile, but in pain. No N/V.   --/--/B POS Performed at [facility], [address] [place], [place] [address]  (07/06 2306)  OB History  Gravida Para Term Preterm AB Living  1 1 1     1   SAB IAB Ectopic Multiple Live Births        0 1    # Outcome Date GA Lbr Len/2nd Weight Sex Delivery Anes PTL Lv  1 Term 04/14/19 [redacted]w[redacted]d 00:30 / 00:04 2648 g F Vag-Spont Local  LIV     Past Medical History:  Diagnosis Date   Asthma    Pregnancy induced hypertension     Past Surgical History:  Procedure Laterality Date   MOUTH SURGERY      Family History  Problem Relation Age of Onset   Hypertension Mother     Social History   Socioeconomic History   Marital status: Single    Spouse name: Not on file   Number of children: Not on file   Years of education: Not on file   Highest education level: Not on file  Occupational History   Not on file  Tobacco Use   Smoking status: Never   Smokeless tobacco: Never  Vaping Use   Vaping Use: Never used  Substance and Sexual Activity   Alcohol use: Never   Drug use: Never   Sexual activity: Not on file  Other Topics Concern   Not on file  Social History Narrative   Lives at home with mother, 14yo sister, 12yo sister, 10yo brother, and 23mo brother. No smokers in the home.   Social Determinants of Health   Financial Resource Strain: Not on file  Food Insecurity: Not on file  Transportation Needs: Not on file  Physical Activity: Not on file   Stress: Not on file  Social Connections: Not on file  Intimate Partner Violence: Not on file    No Known Allergies  No current facility-administered medications on file prior to encounter.   Current Outpatient Medications on File Prior to Encounter  Medication Sig Dispense Refill   albuterol (VENTOLIN HFA) 108 (90 Base) MCG/ACT inhaler Inhale 2 puffs into the lungs every 4 (four) hours as needed for wheezing or shortness of breath.     Blood Pressure KIT 1 Device by Does not apply route once a week. To be monitored weekly at home (Patient not taking: Reported on 06/18/2019) 1 kit 0   hydrOXYzine (ATARAX/VISTARIL) 25 MG tablet Take 1 tablet (25 mg total) by mouth at bedtime as needed for itching. (Patient not taking: Reported on 06/18/2019) 30 tablet 2   Prenatal Vit-Fe Fumarate-FA (MULTIVITAMIN-PRENATAL) 27-0.8 MG TABS tablet Take 1 tablet by mouth daily at 12 noon.     sertraline (ZOLOFT) 50 MG tablet Take 1 tablet (50 mg total) by mouth daily. Increase to 2 pills daily (50mg ) in 1 week if no serious side effects. 30 tablet 11    ROS: Pertinent positives and negative per HPI, all others reviewed  and negative  Physical Exam   BP 116/74 (BP Location: Left Arm)   Pulse 88   Temp 98.6 F (37 C) (Oral)   Resp 14   Ht 5\' 2"  (1.575 m)   Wt 63.5 kg   LMP 09/02/2022 (Approximate)   SpO2 98%   BMI 25.61 kg/m   Patient Vitals for the past 24 hrs:  BP Temp Temp src Pulse Resp SpO2 Height Weight  09/17/22 0739 116/74 98.6 F (37 C) Oral 88 14 98 % -- --  09/17/22 0332 125/82 98.5 F (36.9 C) Oral 96 18 100 % -- --  09/17/22 0230 118/79 -- -- (!) 102 15 98 % -- --  09/17/22 0200 (!) 127/90 -- -- -- 18 -- -- --  09/17/22 0130 (!) 139/102 -- -- 89 17 100 % -- --  09/17/22 0102 134/80 -- -- (!) 107 18 99 % -- --  09/17/22 0000 131/86 -- -- 99 18 100 % -- --  09/16/22 2335 (!) 130/92 -- -- 95 -- 100 % -- --  09/16/22 2258 (!) 158/102 97.6 F (36.4 C) -- 95 18 100 % -- --  09/16/22 2257  -- -- -- -- -- -- 5\' 2"  (1.575 m) 63.5 kg    Physical Exam Vitals reviewed.  Constitutional:      Appearance: Normal appearance.  HENT:     Head: Normocephalic and atraumatic.     Right Ear: External ear normal.     Left Ear: External ear normal.  Cardiovascular:     Rate and Rhythm: Normal rate and regular rhythm.  Pulmonary:     Effort: Pulmonary effort is normal.     Breath sounds: Normal breath sounds.  Abdominal:     General: Abdomen is flat.     Palpations: Abdomen is soft.     Tenderness: There is abdominal tenderness in the right lower quadrant.  Skin:    General: Skin is warm.     Capillary Refill: Capillary refill takes less than 2 seconds.  Psychiatric:        Mood and Affect: Mood normal.    Cervical Exam  N/a  Bedside Ultrasound done  FHT N/a  Labs Results for orders placed or performed during the hospital encounter of 09/16/22 (from the past 24 hour(s))  hCG, quantitative, pregnancy     Status: Abnormal   Collection Time: 09/16/22 11:02 PM  Result Value Ref Range   hCG, Beta Chain, Quant, S 20 (H) <5 mIU/mL  Pregnancy, urine     Status: Abnormal   Collection Time: 09/16/22 11:05 PM  Result Value Ref Range   Preg Test, Ur POSITIVE (A) NEGATIVE  CBC     Status: Abnormal   Collection Time: 09/16/22 11:05 PM  Result Value Ref Range   WBC 8.9 4.0 - 10.5 K/uL   RBC 3.37 (L) 3.87 - 5.11 MIL/uL   Hemoglobin 9.6 (L) 12.0 - 15.0 g/dL   HCT 60.4 (L) 54.0 - 98.1 %   MCV 86.4 80.0 - 100.0 fL   MCH 28.5 26.0 - 34.0 pg   MCHC 33.0 30.0 - 36.0 g/dL   RDW 19.1 47.8 - 29.5 %   Platelets 352 150 - 400 K/uL   nRBC 0.0 0.0 - 0.2 %  Comprehensive metabolic panel     Status: None   Collection Time: 09/16/22 11:05 PM  Result Value Ref Range   Sodium 139 135 - 145 mmol/L   Potassium 3.5 3.5 - 5.1 mmol/L   Chloride 104 98 -  111 mmol/L   CO2 25 22 - 32 mmol/L   Glucose, Bld 87 70 - 99 mg/dL   BUN 11 6 - 20 mg/dL   Creatinine, Ser 1.61 0.44 - 1.00 mg/dL    Calcium 9.5 8.9 - 09.6 mg/dL   Total Protein 8.0 6.5 - 8.1 g/dL   Albumin 4.4 3.5 - 5.0 g/dL   AST 27 15 - 41 U/L   ALT 22 0 - 44 U/L   Alkaline Phosphatase 70 38 - 126 U/L   Total Bilirubin 0.3 0.3 - 1.2 mg/dL   GFR, Estimated >04 >54 mL/min   Anion gap 10 5 - 15  ABO/Rh     Status: None   Collection Time: 09/16/22 11:06 PM  Result Value Ref Range   ABO/RH(D)      B POS Performed at [facility], [address] [place], [place] [address]   Urinalysis, Routine w reflex microscopic -Urine, Clean Catch     Status: Abnormal   Collection Time: 09/16/22 11:06 PM  Result Value Ref Range   Color, Urine YELLOW YELLOW   APPearance HAZY (A) CLEAR   Specific Gravity, Urine 1.032 (H) 1.005 - 1.030   pH 6.0 5.0 - 8.0   Glucose, UA NEGATIVE NEGATIVE mg/dL   Hgb urine dipstick LARGE (A) NEGATIVE   Bilirubin Urine NEGATIVE NEGATIVE   Ketones, ur NEGATIVE NEGATIVE mg/dL   Protein, ur 30 (A) NEGATIVE mg/dL   Nitrite NEGATIVE NEGATIVE   Leukocytes,Ua MODERATE (A) NEGATIVE   RBC / HPF >50 0 - 5 RBC/hpf   WBC, UA >50 0 - 5 WBC/hpf   Bacteria, UA RARE (A) NONE SEEN   Squamous Epithelial / HPF 11-20 0 - 5 /HPF   Mucus PRESENT   D-dimer, quantitative     Status: Abnormal   Collection Time: 09/17/22 12:26 AM  Result Value Ref Range   D-Dimer, Quant 6.91 (H) 0.00 - 0.50 ug/mL-FEU  CBC     Status: Abnormal   Collection Time: 09/17/22  7:22 AM  Result Value Ref Range   WBC 8.7 4.0 - 10.5 K/uL   RBC 3.49 (L) 3.87 - 5.11 MIL/uL   Hemoglobin 10.1 (L) 12.0 - 15.0 g/dL   HCT 91.4 (L) 78.2 - 95.6 %   MCV 87.4 80.0 - 100.0 fL   MCH 28.9 26.0 - 34.0 pg   MCHC 33.1 30.0 - 36.0 g/dL   RDW 21.3 08.6 - 57.8 %   Platelets 339 150 - 400 K/uL   nRBC 0.0 0.0 - 0.2 %    Imaging US OB LESS THAN 14 WEEKS WITH OB TRANSVAGINAL  Result Date: 09/17/2022 CLINICAL DATA:  Vaginal bleeding and first-trimester pregnancy. Right-sided abdominal pain. Quantitative beta HCG 20. Recent spontaneous  abortion, rule out products of conception versus new pregnancy. EXAM: OBSTETRIC <14 WK [place] AND TRANSVAGINAL OB US TECHNIQUE: Both transabdominal and transvaginal ultrasound examinations were performed for complete evaluation of the gestation as well as the maternal uterus, adnexal regions, and pelvic cul-de-sac. Transvaginal technique was performed to assess early pregnancy. COMPARISON:  None Available. FINDINGS: No intrauterine gestational sac. Thin endometrium without focal nodularity. Given the very low beta, cannot exclude early intrauterine pregnancy. There is heterogeneous thickening at the right adnexa. Intra-ovarian appearing avascular cyst with internal lace-like appearance suggesting hemorrhagic corpus luteum/cyst, up to 3.8 cm. There is adjacent somewhat serpentine cystic structure with internal mid level echoes and suspected endosalpingeal folds at the right adnexa, favor thickened tube with debris. Hemorrhage, infection, and tubal  pregnancy are considerations in this setting. Moderate pelvic fluid including the same mid level echoes attributed to clot/debris. The left adnexa is unremarkable. Case discussed with Dr. Berton Lan. IMPRESSION: Pregnancy of unknown location with abnormal ultrasound including heterogeneous enlargement of the right adnexa and peritoneal debris. A complex cyst appears intra-ovarian and favors hemorrhagic cyst (3.8 cm). There is suspected right tube thickening with internal debris. Ectopic pregnancy, PID, and ruptured follicle are differential considerations. Recent failed pregnancy with no endometrial thickening for retained products of conception. Electronically Signed   By: Tiburcio Pea M.D.   On: 09/17/2022 05:52   CT Angio Chest PE W and/or Wo Contrast  Result Date: 09/17/2022 CLINICAL DATA:  Pulmonary embolism (PE) suspected, high prob. Abdominal pain, flank pain EXAM: CT ANGIOGRAPHY CHEST WITH CONTRAST TECHNIQUE: Multidetector CT imaging of the chest was performed using  the standard protocol during bolus administration of intravenous contrast. Multiplanar CT image reconstructions and MIPs were obtained to evaluate the vascular anatomy. RADIATION DOSE REDUCTION: This exam was performed according to the departmental dose-optimization program which includes automated exposure control, adjustment of the mA and/or kV according to patient size and/or use of iterative reconstruction technique. CONTRAST:  75mL OMNIPAQUE IOHEXOL 350 MG/ML SOLN COMPARISON:  None Available. FINDINGS: Cardiovascular: No filling defects in the pulmonary arteries to suggest pulmonary emboli. Heart is normal size. Aorta is normal caliber. Mediastinum/Nodes: No mediastinal, hilar, or axillary adenopathy. Trachea and esophagus are unremarkable. Thyroid unremarkable. Lungs/Pleura: Lungs are clear. No focal airspace opacities or suspicious nodules. No effusions. Upper Abdomen: No acute findings Musculoskeletal: Chest wall soft tissues are unremarkable. No acute bony abnormality. Review of the MIP images confirms the above findings. IMPRESSION: No evidence of pulmonary embolus No acute cardiopulmonary disease. Electronically Signed   By: Charlett Nose M.D.   On: 09/17/2022 01:46   DG Chest Portable 1 View  Result Date: 09/17/2022 CLINICAL DATA:  Lower abdominal pain and right-sided flank pain with recent miscarriage 4 weeks ago and persistent vaginal bleeding. EXAM: PORTABLE CHEST 1 VIEW COMPARISON:  March 29, 2017 FINDINGS: The heart size and mediastinal contours are within normal limits. Both lungs are clear. The visualized skeletal structures are unremarkable. IMPRESSION: No active disease. Electronically Signed   By: Aram Candela M.D.   On: 09/17/2022 00:37    MAU Course  MDM: moderate  This patient presents to the ED for concern of   Chief Complaint  Patient presents with   Abdominal Pain     Assessment and Plan  1. Vaginal bleeding in pregnancy - Discharge patient  2. Pleurisy -  Discharge patient  3. Acute cystitis with hematuria - Discharge patient   Took over care from [place]. Discussed plan with Dr. Berton Lan and pt will receive pain regimen and follow up in 48h for repeat HCG. Pt agreed with plan. Appt scheduled for 7/9.  Discharge Instructions     Discharge patient   Complete by: As directed    Discharge disposition: 01-Home or Self Care   Discharge patient date: 09/17/2022     Dispostion: discharged Allergies as of 09/17/2022   No Known Allergies      Medication List     TAKE these medications    albuterol 108 (90 Base) MCG/ACT inhaler Commonly known as: VENTOLIN HFA Inhale 2 puffs into the lungs every 4 (four) hours as needed for wheezing or shortness of breath.   Blood Pressure Kit 1 Device by Does not apply route once a week. To be monitored weekly at home  hydrOXYzine 25 MG tablet Commonly known as: ATARAX Take 1 tablet (25 mg total) by mouth at bedtime as needed for itching.   multivitamin-prenatal 27-0.8 MG Tabs tablet Take 1 tablet by mouth daily at 12 noon.   oxyCODONE 5 MG immediate release tablet Commonly known as: Roxicodone Take 1 tablet (5 mg total) by mouth every 8 (eight) hours as needed.   sertraline 50 MG tablet Commonly known as: Zoloft Take 1 tablet (50 mg total) by mouth daily. Increase to 2 pills daily (50mg ) in 1 week if no serious side effects.        Myrtie Hawk, DO FMOB Fellow, Faculty practice [facility], Center for [facility] 09/17/22  9:04 AM

## 2022-09-17 NOTE — MAU Note (Signed)
.  Julie Spears is a 22 y.o. at Unknown here in MAU reporting: Transfer from [facility] ED via [facility] since 6/22 pt report VB heavy saturating a pad every hour. Pt reports lower right sided ABD sharp pain intermittently. Pt report taking Motrin, but has not been helping.   LMP: 08/21/22 Onset of complaint: ongoing Pain score: 4/10 Vitals:   09/17/22 0230 09/17/22 0332  BP: 118/79 125/82  Pulse: (!) 102 96  Resp: 15 18  Temp:  98.5 F (36.9 C)  SpO2: 98% 100%      Lab orders placed from triage:

## 2022-09-19 ENCOUNTER — Other Ambulatory Visit: Payer: Self-pay

## 2022-09-19 ENCOUNTER — Inpatient Hospital Stay (HOSPITAL_COMMUNITY)
Admission: AD | Admit: 2022-09-19 | Discharge: 2022-09-19 | Disposition: A | Discharge status: Home / Self Care | Payer: Medicaid Other | Attending: Obstetrics & Gynecology | Admitting: Obstetrics & Gynecology

## 2022-09-19 ENCOUNTER — Inpatient Hospital Stay (HOSPITAL_COMMUNITY)
Admit: 2022-09-19 | Discharge: 2022-09-19 | Disposition: A | Discharge status: Home / Self Care | Payer: Medicaid Other | Attending: Obstetrics and Gynecology | Admitting: Obstetrics and Gynecology

## 2022-09-19 DIAGNOSIS — O039 Complete or unspecified spontaneous abortion without complication: Secondary | ICD-10-CM | POA: Diagnosis present

## 2022-09-19 DIAGNOSIS — Z3A00 Weeks of gestation of pregnancy not specified: Secondary | ICD-10-CM | POA: Diagnosis not present

## 2022-09-19 LAB — HCG, QUANTITATIVE, PREGNANCY: hCG, Beta Chain, Quant, S: 16 m[IU]/mL — ABNORMAL HIGH (ref <5)

## 2022-09-19 NOTE — MAU Provider Note (Signed)
S [name] is a 22 y.o. G79P1001 female who presents to MAU today with complaint of needing BHCG.  Has miscarriaged 6/1 of 4 week pregnancy.   ROS: Abdominal pain, well controlled with medication (oxycodone) but patient reports meds are making her sleepy  O BP 132/81 (BP Location: Right Arm)   Pulse (!) 103   Temp 98.5 F (36.9 C) (Oral)   Resp 18   Ht 5\' 3"  (1.6 m)   Wt 62.8 kg   LMP 09/02/2022 (Approximate)   SpO2 100%   BMI 24.53 kg/m   Physical Exam Vitals and nursing note reviewed.  Constitutional:      General: She is not in acute distress.    Appearance: She is well-developed.  HENT:     Head: Normocephalic and atraumatic.  Eyes:     General: No scleral icterus.    Conjunctiva/sclera: Conjunctivae normal.  Cardiovascular:     Rate and Rhythm: Normal rate.  Pulmonary:     Effort: Pulmonary effort is normal.  Chest:     Chest wall: No tenderness.  Abdominal:     Palpations: Abdomen is soft.     Tenderness: There is no abdominal tenderness. There is no guarding or rebound.  Genitourinary:    Vagina: Normal.  Musculoskeletal:        General: Normal range of motion.     Cervical back: Normal range of motion and neck supple.  Skin:    General: Skin is warm and dry.     Findings: No rash.  Neurological:     Mental Status: She is alert and oriented to person, place, and time.    Lab Results  Component Value Date   HCGBETAQNT 16 (H) 09/19/2022   HCGBETAQNT 20 (H) 09/16/2022    MDM: low  A Decreasing BHCG Pelvic pain, likely related  previously visualized right hemorrhagic cyst  P Discharge from MAU in stable condition Plan for beta HCG in office in 1 week, follow values to zero given pregnancy unknown location.  Recommended scheduled Tylenol and Ibuprofen alternating every 4 hours to help with inflammatory pain If presents again to MAU Toradol might be helpful.   Future Appointments  Date Time Provider Department Center  09/26/2022  9:30 AM  [facility]    Federico Flake, MD 09/19/2022 6:56 PM

## 2022-09-19 NOTE — MAU Note (Signed)
Julie Spears is a 22 y.o. at Unknown here in MAU reporting: she was seen 2 days ago for ruptured cyst and returns today for HCG level.  Denies VB, but c/o pain in RLQ.  Reports pain is intermittent and sharp.  Has a Rx for Oxycodone, last took @ noon today. LMP: NA Onset of complaint: 2 days ago Pain score: 6 Vitals:   09/19/22 1615  BP: 132/81  Pulse: (!) 103  Resp: 18  Temp: 98.5 F (36.9 C)  SpO2: 100%     FHT:NA Lab orders placed from triage:   HCG level

## 2022-09-25 ENCOUNTER — Other Ambulatory Visit: Payer: Self-pay

## 2022-09-25 DIAGNOSIS — O039 Complete or unspecified spontaneous abortion without complication: Secondary | ICD-10-CM

## 2022-09-26 ENCOUNTER — Other Ambulatory Visit: Payer: Self-pay

## 2022-12-01 ENCOUNTER — Encounter (HOSPITAL_BASED_OUTPATIENT_CLINIC_OR_DEPARTMENT_OTHER): Payer: Self-pay | Admitting: Emergency Medicine

## 2022-12-01 ENCOUNTER — Other Ambulatory Visit: Payer: Self-pay

## 2022-12-01 DIAGNOSIS — S161XXA Strain of muscle, fascia and tendon at neck level, initial encounter: Secondary | ICD-10-CM | POA: Diagnosis not present

## 2022-12-01 DIAGNOSIS — Y92410 Unspecified street and highway as the place of occurrence of the external cause: Secondary | ICD-10-CM | POA: Insufficient documentation

## 2022-12-01 DIAGNOSIS — S199XXA Unspecified injury of neck, initial encounter: Secondary | ICD-10-CM | POA: Diagnosis present

## 2022-12-01 DIAGNOSIS — S39012A Strain of muscle, fascia and tendon of lower back, initial encounter: Secondary | ICD-10-CM | POA: Insufficient documentation

## 2022-12-01 LAB — PREGNANCY, URINE: Preg Test, Ur: NEGATIVE

## 2022-12-01 MED ORDER — ACETAMINOPHEN 325 MG PO TABS
650.0000 mg | ORAL_TABLET | Freq: Once | ORAL | Status: AC
  Administered 2022-12-01: 650 mg via ORAL
  Filled 2022-12-01: qty 2
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
## 2022-12-02 ENCOUNTER — Emergency Department (HOSPITAL_BASED_OUTPATIENT_CLINIC_OR_DEPARTMENT_OTHER)
Admission: EM | Admit: 2022-12-02 | Discharge: 2022-12-02 | Disposition: A | Discharge status: Home / Self Care | Payer: No Typology Code available for payment source | Attending: Emergency Medicine | Admitting: Emergency Medicine

## 2022-12-02 DIAGNOSIS — S161XXA Strain of muscle, fascia and tendon at neck level, initial encounter: Secondary | ICD-10-CM | POA: Diagnosis not present

## 2022-12-02 DIAGNOSIS — S39012A Strain of muscle, fascia and tendon of lower back, initial encounter: Secondary | ICD-10-CM

## 2022-12-02 MED ORDER — METHOCARBAMOL 500 MG PO TABS: 500.0000 mg | ORAL_TABLET | Freq: Two times a day (BID) | ORAL | 0 refills | Status: AC

## 2022-12-02 MED ORDER — NAPROXEN 250 MG PO TABS
500.0000 mg | ORAL_TABLET | Freq: Once | ORAL | Status: AC
  Administered 2022-12-02: 500 mg via ORAL
  Filled 2022-12-02: qty 2

## 2022-12-02 MED ORDER — METHOCARBAMOL 500 MG PO TABS
500.0000 mg | ORAL_TABLET | Freq: Once | ORAL | Status: AC
  Administered 2022-12-02: 500 mg via ORAL
  Filled 2022-12-02: qty 1

## 2022-12-02 MED ORDER — NAPROXEN 500 MG PO TABS: 500.0000 mg | ORAL_TABLET | Freq: Two times a day (BID) | ORAL | 0 refills | Status: AC
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
## 2023-08-21 ENCOUNTER — Encounter (HOSPITAL_BASED_OUTPATIENT_CLINIC_OR_DEPARTMENT_OTHER): Payer: Self-pay | Admitting: Emergency Medicine

## 2023-08-21 ENCOUNTER — Other Ambulatory Visit: Payer: Self-pay

## 2023-08-21 ENCOUNTER — Emergency Department (HOSPITAL_BASED_OUTPATIENT_CLINIC_OR_DEPARTMENT_OTHER)
Admission: EM | Admit: 2023-08-21 | Discharge: 2023-08-21 | Disposition: A | Discharge status: Home / Self Care | Attending: Emergency Medicine | Admitting: Emergency Medicine

## 2023-08-21 DIAGNOSIS — R109 Unspecified abdominal pain: Secondary | ICD-10-CM

## 2023-08-21 DIAGNOSIS — N939 Abnormal uterine and vaginal bleeding, unspecified: Secondary | ICD-10-CM | POA: Diagnosis not present

## 2023-08-21 DIAGNOSIS — R1030 Lower abdominal pain, unspecified: Secondary | ICD-10-CM | POA: Insufficient documentation

## 2023-08-21 DIAGNOSIS — J45909 Unspecified asthma, uncomplicated: Secondary | ICD-10-CM | POA: Insufficient documentation

## 2023-08-21 DIAGNOSIS — D649 Anemia, unspecified: Secondary | ICD-10-CM | POA: Diagnosis not present

## 2023-08-21 LAB — COMPREHENSIVE METABOLIC PANEL WITH GFR
ALT: 14 U/L (ref 0–44)
AST: 32 U/L (ref 15–41)
Albumin: 4.3 g/dL (ref 3.5–5.0)
Alkaline Phosphatase: 83 U/L (ref 38–126)
Anion gap: 11 (ref 5–15)
BUN: 8 mg/dL (ref 6–20)
CO2: 23 mmol/L (ref 22–32)
Calcium: 9.3 mg/dL (ref 8.9–10.3)
Chloride: 104 mmol/L (ref 98–111)
Creatinine, Ser: 0.65 mg/dL (ref 0.44–1.00)
GFR, Estimated: >60 mL/min (ref >60)
Glucose, Bld: 64 mg/dL — ABNORMAL LOW (ref 70–99)
Potassium: 3.6 mmol/L (ref 3.5–5.1)
Sodium: 138 mmol/L (ref 135–145)
Total Bilirubin: 0.2 mg/dL (ref 0.0–1.2)
Total Protein: 8.1 g/dL (ref 6.5–8.1)

## 2023-08-21 LAB — CBC WITH DIFFERENTIAL/PLATELET
Abs Immature Granulocytes: 0.02 10*3/uL (ref 0.00–0.07)
Basophils Absolute: 0 10*3/uL (ref 0.0–0.1)
Basophils Relative: 1 %
Eosinophils Absolute: 0.1 10*3/uL (ref 0.0–0.5)
Eosinophils Relative: 1 %
HCT: 31.9 % — ABNORMAL LOW (ref 36.0–46.0)
Hemoglobin: 9.9 g/dL — ABNORMAL LOW (ref 12.0–15.0)
Immature Granulocytes: 0 %
Lymphocytes Relative: 43 %
Lymphs Abs: 3.6 10*3/uL (ref 0.7–4.0)
MCH: 26.3 pg (ref 26.0–34.0)
MCHC: 31 g/dL (ref 30.0–36.0)
MCV: 84.8 fL (ref 80.0–100.0)
Monocytes Absolute: 0.7 10*3/uL (ref 0.1–1.0)
Monocytes Relative: 8 %
Neutro Abs: 4 10*3/uL (ref 1.7–7.7)
Neutrophils Relative %: 47 %
Platelets: 355 10*3/uL (ref 150–400)
RBC: 3.76 MIL/uL — ABNORMAL LOW (ref 3.87–5.11)
RDW: 14.8 % (ref 11.5–15.5)
WBC: 8.4 10*3/uL (ref 4.0–10.5)
nRBC: 0 % (ref 0.0–0.2)

## 2023-08-21 LAB — URINALYSIS, ROUTINE W REFLEX MICROSCOPIC
Bacteria, UA: NONE SEEN
Bilirubin Urine: NEGATIVE
Glucose, UA: NEGATIVE mg/dL
Ketones, ur: NEGATIVE mg/dL
Nitrite: NEGATIVE
RBC / HPF: >50 RBC/hpf (ref 0–5)
Specific Gravity, Urine: 1.024 (ref 1.005–1.030)
pH: 7 (ref 5.0–8.0)

## 2023-08-21 LAB — CBG MONITORING, ED: Glucose-Capillary: 81 mg/dL (ref 70–99)

## 2023-08-21 LAB — HCG, QUANTITATIVE, PREGNANCY: hCG, Beta Chain, Quant, S: <1 m[IU]/mL (ref <5)

## 2023-08-21 MED ORDER — FERROUS SULFATE 325 (65 FE) MG PO TABS: 325.0000 mg | ORAL_TABLET | Freq: Every day | ORAL | 0 refills | Status: AC

## 2023-08-21 NOTE — ED Triage Notes (Signed)
 States had vaginal bleeding since May 29th. Denies BC use. Endorses lower abd cramping. States chance of pregnancy.

## 2023-08-21 NOTE — ED Provider Notes (Signed)
  EMERGENCY DEPARTMENT AT [facility] Provider Note   CSN: [id number] Arrival date & time: 08/21/23  1450     History {Add pertinent medical, surgical, social history, OB history to HPI:1} Chief Complaint  Patient presents with   Vaginal Bleeding    Norvella Loscalzo is a 23 y.o. female.  HPI      20-25 menses Then 29th started again still bleeding until today Cramping lower abdomen Feeling lightheaded, hot flashes  No hx of other irregular menses.  23 yo breastfed for 2.5 yrs, then menses were shorter, now has been more on and off bleeding No fevers, no dysuria, vomiting  Nausea Both diarrhea and constipation, diarrhea now 3-4 times/day for past few weeks  Past Medical History:  Diagnosis Date   Asthma    Pregnancy induced hypertension     Home Medications Prior to Admission medications   Medication Sig Start Date End Date Taking? Authorizing Provider  albuterol (VENTOLIN HFA) 108 (90 Base) MCG/ACT inhaler Inhale 2 puffs into the lungs every 4 (four) hours as needed for wheezing or shortness of breath. 09/17/17   [provider]  hydrOXYzine  (ATARAX /VISTARIL ) 25 MG tablet Take 1 tablet (25 mg total) by mouth at bedtime as needed for itching. Patient not taking: Reported on 06/18/2019 05/21/19   Abner Ables, MD  methocarbamol  (ROBAXIN ) 500 MG tablet Take 1 tablet (500 mg total) by mouth 2 (two) times daily. 12/02/22   Charmayne Cooper, MD  naproxen  (NAPROSYN ) 500 MG tablet Take 1 tablet (500 mg total) by mouth 2 (two) times daily. 12/02/22   Charmayne Cooper, MD  Prenatal Vit-Fe Fumarate-FA (MULTIVITAMIN-PRENATAL) 27-0.8 MG TABS tablet Take 1 tablet by mouth daily at 12 noon.    [provider]  sertraline  (ZOLOFT ) 50 MG tablet Take 1 tablet (50 mg total) by mouth daily. Increase to 2 pills daily (50mg ) in 1 week if no serious side effects. 06/18/19   Abner Ables, MD      Allergies    Patient has no known allergies.     Review of Systems   Review of Systems  Physical Exam Updated Vital Signs BP 130/81   Pulse 79   Temp 98.7 F (37.1 C)   Resp 16   Ht 5\' 3"  (1.6 m)   Wt 63.5 kg   SpO2 97%   BMI 24.80 kg/m  Physical Exam  ED Results / Procedures / Treatments   Labs (all labs ordered are listed, but only abnormal results are displayed) Labs Reviewed  COMPREHENSIVE METABOLIC PANEL WITH GFR - Abnormal; Notable for the following components:      Result Value   Glucose, Bld 64 (*)    All other components within normal limits  CBC WITH DIFFERENTIAL/PLATELET - Abnormal; Notable for the following components:   RBC 3.76 (*)    Hemoglobin 9.9 (*)    HCT 31.9 (*)    All other components within normal limits  URINALYSIS, ROUTINE W REFLEX MICROSCOPIC - Abnormal; Notable for the following components:   APPearance HAZY (*)    Hgb urine dipstick LARGE (*)    Protein, ur TRACE (*)    Leukocytes,Ua MODERATE (*)    All other components within normal limits  HCG, QUANTITATIVE, PREGNANCY    EKG None  Radiology No results found.  Procedures Procedures  {Document cardiac monitor, telemetry assessment procedure when appropriate:1}  Medications Ordered in ED Medications - No data to display  ED Course/ Medical Decision Making/ A&P   {  Click here for ABCD2, HEART and other calculatorsREFRESH Note before signing :1}                              Medical Decision Making  ***  {Document critical care time when appropriate:1} {Document review of labs and clinical decision tools ie heart score, Chads2Vasc2 etc:1}  {Document your independent review of radiology images, and any outside records:1} {Document your discussion with family members, caretakers, and with consultants:1} {Document social determinants of health affecting pt's care:1} {Document your decision making why or why not admission, treatments were needed:1} Final Clinical Impression(s) / ED Diagnoses Final diagnoses:  None     Rx / DC Orders ED Discharge Orders     None

## 2023-08-21 NOTE — ED Notes (Signed)
 Snack given.

## 2024-04-14 ENCOUNTER — Encounter (HOSPITAL_BASED_OUTPATIENT_CLINIC_OR_DEPARTMENT_OTHER): Payer: Self-pay

## 2024-04-14 ENCOUNTER — Other Ambulatory Visit: Payer: Self-pay

## 2024-04-14 ENCOUNTER — Emergency Department (HOSPITAL_BASED_OUTPATIENT_CLINIC_OR_DEPARTMENT_OTHER)

## 2024-04-14 ENCOUNTER — Emergency Department (HOSPITAL_BASED_OUTPATIENT_CLINIC_OR_DEPARTMENT_OTHER): Admission: EM | Admit: 2024-04-14 | Discharge: 2024-04-14 | Disposition: A | Discharge status: Home / Self Care

## 2024-04-14 DIAGNOSIS — N7011 Chronic salpingitis: Secondary | ICD-10-CM | POA: Insufficient documentation

## 2024-04-14 DIAGNOSIS — R102 Pelvic and perineal pain unspecified side: Secondary | ICD-10-CM

## 2024-04-14 DIAGNOSIS — D649 Anemia, unspecified: Secondary | ICD-10-CM | POA: Insufficient documentation

## 2024-04-14 DIAGNOSIS — D72829 Elevated white blood cell count, unspecified: Secondary | ICD-10-CM | POA: Insufficient documentation

## 2024-04-14 LAB — COMPREHENSIVE METABOLIC PANEL WITH GFR
ALT: 13 U/L (ref 0–44)
AST: 28 U/L (ref 15–41)
Albumin: 4.2 g/dL (ref 3.5–5.0)
Alkaline Phosphatase: 78 U/L (ref 38–126)
Anion gap: 11 (ref 5–15)
BUN: 11 mg/dL (ref 6–20)
CO2: 23 mmol/L (ref 22–32)
Calcium: 9.5 mg/dL (ref 8.9–10.3)
Chloride: 103 mmol/L (ref 98–111)
Creatinine, Ser: 0.76 mg/dL (ref 0.44–1.00)
GFR, Estimated: >60 mL/min
Glucose, Bld: 93 mg/dL (ref 70–99)
Potassium: 3.5 mmol/L (ref 3.5–5.1)
Sodium: 138 mmol/L (ref 135–145)
Total Bilirubin: <0.2 mg/dL (ref 0.0–1.2)
Total Protein: 8 g/dL (ref 6.5–8.1)

## 2024-04-14 LAB — URINALYSIS, ROUTINE W REFLEX MICROSCOPIC
Bacteria, UA: NONE SEEN
Bilirubin Urine: NEGATIVE
Glucose, UA: NEGATIVE mg/dL
Ketones, ur: NEGATIVE mg/dL
Nitrite: NEGATIVE
Protein, ur: 100 mg/dL — AB
RBC / HPF: >50 RBC/hpf (ref 0–5)
Specific Gravity, Urine: 1.028 (ref 1.005–1.030)
WBC, UA: >50 WBC/hpf (ref 0–5)
pH: 6.5 (ref 5.0–8.0)

## 2024-04-14 LAB — WET PREP, GENITAL
Clue Cells Wet Prep HPF POC: NONE SEEN
Sperm: NONE SEEN
Trich, Wet Prep: NONE SEEN
WBC, Wet Prep HPF POC: <10
Yeast Wet Prep HPF POC: NONE SEEN

## 2024-04-14 LAB — CBC WITH DIFFERENTIAL/PLATELET
Abs Immature Granulocytes: 0.02 10*3/uL (ref 0.00–0.07)
Basophils Absolute: 0 10*3/uL (ref 0.0–0.1)
Basophils Relative: 0 %
Eosinophils Absolute: 0.1 10*3/uL (ref 0.0–0.5)
Eosinophils Relative: 1 %
HCT: 32.3 % — ABNORMAL LOW (ref 36.0–46.0)
Hemoglobin: 10.2 g/dL — ABNORMAL LOW (ref 12.0–15.0)
Immature Granulocytes: 0 %
Lymphocytes Relative: 36 %
Lymphs Abs: 3.9 10*3/uL (ref 0.7–4.0)
MCH: 26.1 pg (ref 26.0–34.0)
MCHC: 31.6 g/dL (ref 30.0–36.0)
MCV: 82.6 fL (ref 80.0–100.0)
Monocytes Absolute: 0.8 10*3/uL (ref 0.1–1.0)
Monocytes Relative: 7 %
Neutro Abs: 6.1 10*3/uL (ref 1.7–7.7)
Neutrophils Relative %: 56 %
Platelets: 310 10*3/uL (ref 150–400)
RBC: 3.91 MIL/uL (ref 3.87–5.11)
RDW: 14.9 % (ref 11.5–15.5)
WBC: 11 10*3/uL — ABNORMAL HIGH (ref 4.0–10.5)
nRBC: 0 % (ref 0.0–0.2)

## 2024-04-14 LAB — PREGNANCY, URINE: Preg Test, Ur: NEGATIVE

## 2024-04-14 LAB — LIPASE, BLOOD: Lipase: 28 U/L (ref 11–51)

## 2024-04-14 MED ORDER — DOXYCYCLINE HYCLATE 100 MG PO TABS: 100.0000 mg | ORAL_TABLET | Freq: Two times a day (BID) | ORAL | 0 refills | Status: AC

## 2024-04-14 MED ORDER — CEFTRIAXONE SODIUM 500 MG IJ SOLR
500.0000 mg | Freq: Once | INTRAMUSCULAR | Status: AC
  Administered 2024-04-14: 500 mg via INTRAMUSCULAR
  Filled 2024-04-14: qty 500

## 2024-04-14 MED ORDER — LIDOCAINE HCL (PF) 1 % IJ SOLN
1.0000 mL | Freq: Once | INTRAMUSCULAR | Status: AC
  Administered 2024-04-14: 1 mL
  Filled 2024-04-14: qty 5

## 2024-04-14 NOTE — ED Triage Notes (Signed)
 Vaginal bleeding onset 04/09/2024 with normal bleeding. Started having LLQ abd pain, sudden onset. Heavy vaginal bleeding. Irregular period in January.

## 2024-04-14 NOTE — Discharge Instructions (Signed)
 Your US  showed a hydrosalpinx. Although this was an incidental findings and was not on the same side that you are having pain I did discuss the case with the gynecologist who wanted to make sure that you were treated appropriately with antibiotics in case there is any infectious process going on.  This may also be a result of a previous irritation or infection. You be given a shot of Rocephin  here and you will be discharged with doxycycline  to take by mouth 2 times a day for the next 2 weeks.  You may follow-up with gynecology and return for any worsening pain, fevers, abdominal bloating or other concerns.

## 2024-04-16 LAB — GC/CHLAMYDIA PROBE AMP (~~LOC~~) NOT AT ARMC
Chlamydia: POSITIVE — AB
Comment: NEGATIVE
Comment: NORMAL
Neisseria Gonorrhea: NEGATIVE

## 2024-04-18 ENCOUNTER — Ambulatory Visit (HOSPITAL_COMMUNITY): Payer: Self-pay

## 2024-06-18 ENCOUNTER — Encounter: Admitting: Obstetrics and Gynecology
